# Patient Record
Sex: Female | Born: 1975
Health system: Southern US, Community
[De-identification: ages and names within clinical notes are randomized; demographics above are authoritative.]

## PROBLEM LIST (undated history)

## (undated) DIAGNOSIS — O24419 Gestational diabetes mellitus in pregnancy, unspecified control: Secondary | ICD-10-CM

## (undated) DIAGNOSIS — IMO0002 Reserved for concepts with insufficient information to code with codable children: Secondary | ICD-10-CM

## (undated) DIAGNOSIS — N390 Urinary tract infection, site not specified: Secondary | ICD-10-CM

## (undated) HISTORY — PX: HERNIA REPAIR: SHX51

## (undated) HISTORY — PX: CHOLECYSTECTOMY: SHX55

## (undated) HISTORY — PX: TUBAL LIGATION: SHX77

## (undated) HISTORY — PX: EXPLORATORY LAPAROTOMY: SUR591

---

## 2000-05-08 ENCOUNTER — Encounter: Payer: Self-pay | Admitting: Emergency Medicine

## 2000-05-08 ENCOUNTER — Emergency Department (HOSPITAL_COMMUNITY): Admission: EM | Admit: 2000-05-08 | Discharge: 2000-05-08 | Payer: Self-pay | Admitting: Emergency Medicine

## 2000-11-25 ENCOUNTER — Inpatient Hospital Stay (HOSPITAL_COMMUNITY): Admission: AD | Admit: 2000-11-25 | Discharge: 2000-11-25 | Payer: Self-pay | Admitting: *Deleted

## 2000-11-26 ENCOUNTER — Emergency Department (HOSPITAL_COMMUNITY): Admission: EM | Admit: 2000-11-26 | Discharge: 2000-11-27 | Payer: Self-pay | Admitting: Emergency Medicine

## 2001-03-13 ENCOUNTER — Encounter: Admission: RE | Admit: 2001-03-13 | Discharge: 2001-06-11 | Payer: Self-pay | Admitting: Obstetrics & Gynecology

## 2001-04-07 ENCOUNTER — Inpatient Hospital Stay (HOSPITAL_COMMUNITY): Admission: AD | Admit: 2001-04-07 | Discharge: 2001-04-07 | Payer: Self-pay | Admitting: Obstetrics and Gynecology

## 2001-04-09 ENCOUNTER — Inpatient Hospital Stay (HOSPITAL_COMMUNITY): Admission: AD | Admit: 2001-04-09 | Discharge: 2001-04-09 | Payer: Self-pay | Admitting: Obstetrics & Gynecology

## 2001-04-10 ENCOUNTER — Inpatient Hospital Stay (HOSPITAL_COMMUNITY): Admission: AD | Admit: 2001-04-10 | Discharge: 2001-04-10 | Payer: Self-pay | Admitting: Obstetrics and Gynecology

## 2001-04-16 ENCOUNTER — Inpatient Hospital Stay (HOSPITAL_COMMUNITY): Admission: AD | Admit: 2001-04-16 | Discharge: 2001-04-16 | Payer: Self-pay | Admitting: Obstetrics & Gynecology

## 2001-05-07 ENCOUNTER — Encounter: Payer: Self-pay | Admitting: Obstetrics & Gynecology

## 2001-05-07 ENCOUNTER — Ambulatory Visit (HOSPITAL_COMMUNITY): Admission: RE | Admit: 2001-05-07 | Discharge: 2001-05-07 | Payer: Self-pay | Admitting: Obstetrics & Gynecology

## 2001-05-19 ENCOUNTER — Inpatient Hospital Stay (HOSPITAL_COMMUNITY): Admission: AD | Admit: 2001-05-19 | Discharge: 2001-05-19 | Payer: Self-pay | Admitting: Obstetrics and Gynecology

## 2001-05-30 ENCOUNTER — Inpatient Hospital Stay (HOSPITAL_COMMUNITY): Admission: AD | Admit: 2001-05-30 | Discharge: 2001-06-02 | Payer: Self-pay | Admitting: Obstetrics & Gynecology

## 2001-05-30 ENCOUNTER — Encounter (INDEPENDENT_AMBULATORY_CARE_PROVIDER_SITE_OTHER): Payer: Self-pay

## 2001-07-03 ENCOUNTER — Other Ambulatory Visit: Admission: RE | Admit: 2001-07-03 | Discharge: 2001-07-03 | Payer: Self-pay | Admitting: Obstetrics & Gynecology

## 2002-03-19 ENCOUNTER — Encounter: Admission: RE | Admit: 2002-03-19 | Discharge: 2002-03-19 | Payer: Self-pay | Admitting: Obstetrics & Gynecology

## 2002-03-19 ENCOUNTER — Encounter: Payer: Self-pay | Admitting: Obstetrics & Gynecology

## 2003-01-27 ENCOUNTER — Emergency Department (HOSPITAL_COMMUNITY): Admission: EM | Admit: 2003-01-27 | Discharge: 2003-01-27 | Payer: Self-pay | Admitting: Emergency Medicine

## 2003-06-14 ENCOUNTER — Emergency Department (HOSPITAL_COMMUNITY): Admission: AD | Admit: 2003-06-14 | Discharge: 2003-06-15 | Payer: Self-pay | Admitting: Emergency Medicine

## 2003-06-15 ENCOUNTER — Encounter: Payer: Self-pay | Admitting: Emergency Medicine

## 2003-08-04 ENCOUNTER — Emergency Department (HOSPITAL_COMMUNITY): Admission: EM | Admit: 2003-08-04 | Discharge: 2003-08-05 | Payer: Self-pay | Admitting: Emergency Medicine

## 2005-01-09 ENCOUNTER — Emergency Department (HOSPITAL_COMMUNITY): Admission: EM | Admit: 2005-01-09 | Discharge: 2005-01-09 | Payer: Self-pay | Admitting: Emergency Medicine

## 2006-03-24 ENCOUNTER — Other Ambulatory Visit: Admission: RE | Admit: 2006-03-24 | Discharge: 2006-03-24 | Payer: Self-pay | Admitting: Internal Medicine

## 2006-04-06 ENCOUNTER — Encounter: Admission: RE | Admit: 2006-04-06 | Discharge: 2006-04-06 | Payer: Self-pay | Admitting: Internal Medicine

## 2007-04-05 ENCOUNTER — Emergency Department (HOSPITAL_COMMUNITY): Admission: EM | Admit: 2007-04-05 | Discharge: 2007-04-05 | Payer: Self-pay | Admitting: Emergency Medicine

## 2008-04-01 ENCOUNTER — Emergency Department (HOSPITAL_COMMUNITY): Admission: EM | Admit: 2008-04-01 | Discharge: 2008-04-01 | Payer: Self-pay | Admitting: Emergency Medicine

## 2008-05-04 ENCOUNTER — Emergency Department (HOSPITAL_COMMUNITY): Admission: EM | Admit: 2008-05-04 | Discharge: 2008-05-04 | Payer: Self-pay | Admitting: *Deleted

## 2008-05-12 ENCOUNTER — Emergency Department (HOSPITAL_COMMUNITY): Admission: EM | Admit: 2008-05-12 | Discharge: 2008-05-12 | Payer: Self-pay | Admitting: Emergency Medicine

## 2008-06-24 ENCOUNTER — Emergency Department (HOSPITAL_BASED_OUTPATIENT_CLINIC_OR_DEPARTMENT_OTHER): Admission: EM | Admit: 2008-06-24 | Discharge: 2008-06-24 | Payer: Self-pay | Admitting: Emergency Medicine

## 2008-07-18 ENCOUNTER — Emergency Department (HOSPITAL_BASED_OUTPATIENT_CLINIC_OR_DEPARTMENT_OTHER): Admission: EM | Admit: 2008-07-18 | Discharge: 2008-07-18 | Payer: Self-pay | Admitting: Emergency Medicine

## 2009-01-12 ENCOUNTER — Emergency Department (HOSPITAL_BASED_OUTPATIENT_CLINIC_OR_DEPARTMENT_OTHER): Admission: EM | Admit: 2009-01-12 | Discharge: 2009-01-12 | Payer: Self-pay | Admitting: Emergency Medicine

## 2009-02-22 ENCOUNTER — Emergency Department (HOSPITAL_BASED_OUTPATIENT_CLINIC_OR_DEPARTMENT_OTHER): Admission: EM | Admit: 2009-02-22 | Discharge: 2009-02-22 | Payer: Self-pay | Admitting: Emergency Medicine

## 2009-06-03 ENCOUNTER — Emergency Department (HOSPITAL_BASED_OUTPATIENT_CLINIC_OR_DEPARTMENT_OTHER): Admission: EM | Admit: 2009-06-03 | Discharge: 2009-06-03 | Payer: Self-pay | Admitting: Emergency Medicine

## 2010-06-09 ENCOUNTER — Emergency Department (HOSPITAL_BASED_OUTPATIENT_CLINIC_OR_DEPARTMENT_OTHER): Admission: EM | Admit: 2010-06-09 | Discharge: 2010-06-09 | Payer: Self-pay | Admitting: Emergency Medicine

## 2011-01-27 LAB — RAPID STREP SCREEN (MED CTR MEBANE ONLY): Streptococcus, Group A Screen (Direct): POSITIVE — AB

## 2011-03-04 NOTE — Discharge Summary (Signed)
[facility]  Patient:    Michelle Luna, Michelle Luna Visit Number: [id number] MRN: [id number]          Service Type: OBS Location: 910A [id number] Attending Physician:  Mickle Mallory Dictated by:   Miguel Aschoff, M.D. Admit Date:  05/30/2001 Discharge Date: 06/02/2001                             Discharge Summary  ADMISSION DIAGNOSES:          1. Intrauterine twin gestation at term.                               2. Desired sterilization.  OPERATIONS/PROCEDURES:        1. Primary low-flap transverse cesarean section.                               2. Bilateral tubal sterilization.  HISTORY OF PRESENT ILLNESS:   The patient is a 34 year old white female, gravida 2, para 1-0-0-1, with an estimated date of confinement of June 08, 2001.  The patient was noted to have an intrauterine twin gestation presenting as vertex breech.  In addition, the patient has requested that a sterilization procedure be performed.  Because of the presentation of the second twin, the options of delivery were discussed, and the patient opted to undergo primary cesarean section.  In addition, the patient requested a sterilization procedure.  HOSPITAL COURSE:              On May 30, 2001, under spinal anesthesia a primary low-flap transverse cesarean section was carried out with delivery of a viable female infant weighing 7 pounds with an Apgar score of 8 at one minute and 9 at five minutes and delivery of a viable female infant weighing 5 pounds 4 ounces with an Apgar score of 8 at one minute and 9 at five minutes.  In addition, tubal sterilization was carried out.  The patients postpartum course was essentially uncomplicated.  She tolerated increasing ambulation and diet well.  Her hemoglobin remained stable with a nadir of 11.1.  She was in satisfactory condition and able to be discharged home on June 02, 2001.  MEDICATIONS:                  Tylox 1-2 every three hours as  needed for pain.  DISCHARGE INSTRUCTIONS:       She was instructed to do no heavy lifting. Place nothing in the vagina.  Call if there are any problems such as fever, pain, or heavy bleeding.  A routine postpartum booklet of instructions was sent home with the patient.  FOLLOW-UP:                    The patient will be seen back in the office in five to six weeks for a follow-up evaluation. Dictated by:   Miguel Aschoff, M.D. Attending Physician:  Mickle Mallory DD:  07/08/01 TD:  07/08/01 Job: [id number]

## 2011-03-04 NOTE — Op Note (Signed)
[facility]  Patient:    Michelle Luna, Michelle Luna                   MRN: [id number] Proc. Date: 05/30/01 Adm. Date:  [id number] Attending:  Mickle Mallory                           Operative Report  PATIENTS AGE IS 35.  PREOPERATIVE DIAGNOSES:       1. Term pregnancy, twin pregnancy.                               2. Desire for permanent elective sterilization.  POSTOPERATIVE DIAGNOSES:      1. Term pregnancy, twin pregnancy.                               2. Desire for permanent elective sterilization.                               3. Pathology pending on sections of fallopian                                  tube.                               4. Twin A--female--7 pounds--Apgars 8 and 9 and                                  Twin B--female--5 pounds 4 ounces--Apgars 8                                  and 9.  PROCEDURE:                    1. Primary low transverse cesarean section.                               2. Tubal sterilization for permanent elective                                  sterilization.  SURGEON:                      Gerrit Friends. Aldona Bar, M.D.  ANESTHESIA:                   Subarachnoid block, Dr. Tacy Dura.  HISTORY:                      This 35 year old, gravida 2, para 1, with known twins has been followed carefully in the office and most recently was vertex/breech with fairly good growth of both twins. She is desirous of a permanent elective sterilization procedure understanding that such procedures are meant to be 100% permanent, but unfortunately is not 100% perfect. She is also desirous of a cesarean section delivery, especially with the second baby being breech.  She is taken to the operating room now for cesarean section and tubal sterilization procedure.  ESTIMATED BLOOD LOSS:         750 cc.  DESCRIPTION OF PROCEDURE:     The patient was taken to the operating room where after satisfactory induction of subarachnoid block by  Dr. Tacy Dura, the patient was prepped and draped in the usual fashion having been placed in supine position with slight tilt to the left. A Foley catheter was inserted as part of the prep.  After the patient was adequately draped, anesthetic levels were checked and noted to be adequate. Thereafter, a Pfannenstiel incision was made with minimal difficulty, dissected down sharply to and through the fascia with hemostasis created at each layer. The fascia was incised in low transverse fashion, subfascial space was created inferiorly and superiorly, muscles separated in the midline, the peritoneum identified and entered appropriately with care taken to avoid the bowel superiorly and the bladder inferiorly. At this time, the vesicouterine peritoneum was incised in low transverse fashion, and pushed off the lower uterine segment with ease. Sharp incision of the uterus in the low transverse fashion was then made with the Metzenbaum scissors. This incision was extended with the fingers. Twin A was vertex--fluid was clear--and with minimal difficulty, (vacuum extractor was employed for an easy delivery) a viable female infant with Apgars of 8 and 9, subsequently found to be weighing 7 pounds, was delivered from a vertex position. The cord was clamped and cut and the infant was passed off to the awaiting team. Thereafter, Twin B was found to be in a breech presentation, actually a back down transverse presentation. Internal version was done to a vertex and amniotomy then carried out with production of clear fluid and thereafter, Twin B was delivered from a vertex position. Twin B was female, weighed 5 pounds 4 ounces, had Apgars of 8 and 9.  After that babys cord was clamped and cut that infant was passed off to the awaiting team as well. Both infants were subsequently taken to the nursery in good condition.  After the cord bloods were collected from Twin A and Twin B, respectively, the placenta was  delivered intact. The uterus at this time was exteriorized and manually inspected and found free of any remaining products of conception. Closure of the uterus at this time was carried out using single layer of #1 Vicryl in a running locking fashion. Hemostasis was adequate at the site of the uterine incision. Tubes and ovaries were normal, and with both babies being normal, tubal sterilization procedure was carried out.  A segment of the left fallopian tube was elevated, and using a free tie of #1 plain catgut suture, a knuckle of the tube was isolated, cut, and removed. A similar procedure was carried out on the right fallopian tube. Both segments of tube were sent appropriately labeled.  At this time with no pathology appreciated, good hemostasis at the site of each tubal sterilization site, and the uterine incision likewise being hemostatic, the uterus was then replaced into the abdomen and abdomen lavaged of all free blood and clot. Good uterine contractility was afforded with slowly given intravenous Pitocin and manual stimulation. At this time, closure of the abdomen was begun in layers. The abdominoperitoneum was closed with 0 Vicryl in a running fashion, the muscles secured with same. Assured of good subfascial hemostasis, the fascia was then reapproximated with 0 Vicryl from angle to midline bilaterally. Subcutaneous tissue was then rendered hemostatic and staples  were then used to close the skin. A sterile pressure dressing was applied, and the patient at this time was transported to the recovery room in satisfactory condition having tolerated the procedure well. Estimated blood loss was 750 cc. All counts were correct x 2. At the conclusion of the procedure, both mother and the two babies were doing well in their respective recovery areas.  SPECIMEN:                     Pathologic specimen consisted of a segment of each fallopian tube.  CONDITION ON ARRIVAL TO RECOVERY  ROOM:             Satisfactory. DD:  05/30/01 TD:  05/30/01 Job: [id number]

## 2011-05-11 ENCOUNTER — Emergency Department (HOSPITAL_BASED_OUTPATIENT_CLINIC_OR_DEPARTMENT_OTHER)
Admission: EM | Admit: 2011-05-11 | Discharge: 2011-05-11 | Disposition: A | Discharge status: Home / Self Care | Payer: Self-pay | Attending: Emergency Medicine | Admitting: Emergency Medicine

## 2011-05-11 ENCOUNTER — Encounter: Payer: Self-pay | Admitting: Emergency Medicine

## 2011-05-11 DIAGNOSIS — Z79899 Other long term (current) drug therapy: Secondary | ICD-10-CM | POA: Insufficient documentation

## 2011-05-11 DIAGNOSIS — T63461A Toxic effect of venom of wasps, accidental (unintentional), initial encounter: Secondary | ICD-10-CM | POA: Insufficient documentation

## 2011-05-11 DIAGNOSIS — IMO0002 Reserved for concepts with insufficient information to code with codable children: Secondary | ICD-10-CM | POA: Insufficient documentation

## 2011-05-11 DIAGNOSIS — T6391XA Toxic effect of contact with unspecified venomous animal, accidental (unintentional), initial encounter: Secondary | ICD-10-CM | POA: Insufficient documentation

## 2011-05-11 DIAGNOSIS — T782XXA Anaphylactic shock, unspecified, initial encounter: Secondary | ICD-10-CM

## 2011-05-11 DIAGNOSIS — F17200 Nicotine dependence, unspecified, uncomplicated: Secondary | ICD-10-CM | POA: Insufficient documentation

## 2011-05-11 HISTORY — DX: Reserved for concepts with insufficient information to code with codable children: IMO0002

## 2011-05-11 MED ORDER — METHYLPREDNISOLONE SODIUM SUCC 125 MG IJ SOLR
80.0000 mg | Freq: Once | INTRAMUSCULAR | Status: AC
  Administered 2011-05-11: 81.25 mg via INTRAVENOUS
  Filled 2011-05-11: qty 2

## 2011-05-11 MED ORDER — PREDNISONE 10 MG PO TABS: 20.0000 mg | ORAL_TABLET | Freq: Two times a day (BID) | ORAL | Status: AC

## 2011-05-11 MED ORDER — EPINEPHRINE 0.3 MG/0.3ML IJ DEVI: 0.3000 mg | INTRAMUSCULAR | Status: AC | PRN

## 2011-05-11 MED ORDER — EPINEPHRINE 0.3 MG/0.3ML IJ DEVI
0.3000 mg | Freq: Once | INTRAMUSCULAR | Status: AC
  Administered 2011-05-11: 0.3 mg via SUBCUTANEOUS
  Filled 2011-05-11: qty 0.3

## 2011-05-11 NOTE — ED Notes (Signed)
Pt stung by a bee on chin. Pt had allergic reaction including wheezing, tachycardia and diaphoresis per EMT. Pt received benadryl, zantac, and albuterol. Pt reports feeling better on arrival.

## 2011-05-11 NOTE — ED Notes (Signed)
Pt given blanket and ice water. Family at bedside

## 2011-05-11 NOTE — ED Provider Notes (Signed)
History     Chief Complaint  Patient presents with  . Insect Bite   HPI Comments: Was stung on chin by some sort of bee/wasp.  Shortly after, began to have swelling in the chin, dizziness, shortness of breath.  Daughter called 911, was given meds by ambulance.  Feeling better now.  Has had minor reactions to bee stings in the past (rash) but nothing like this.  The history is provided by the patient.    Past Medical History  Diagnosis Date  . Degenerative disk disease     Past Surgical History  Procedure Date  . Cesarean section   . Cholecystectomy   . Exploratory laparotomy     No family history on file.  History  Substance Use Topics  . Smoking status: Current Everyday Smoker  . Smokeless tobacco: Not on file  . Alcohol Use: Yes    OB History    Grav Para Term Preterm Abortions TAB SAB Ect Mult Living                  Review of Systems  Respiratory: Positive for shortness of breath and wheezing.   Cardiovascular: Negative for chest pain.  Skin:       Swelling as above.  All other systems reviewed and are negative.    Physical Exam  BP 113/53  Pulse 103  Temp(Src) 98.2 F (36.8 C) (Oral)  Resp 20  SpO2 97%  Physical Exam  Constitutional: She appears well-developed and well-nourished. No distress.  HENT:  Head: Normocephalic and atraumatic.  Mouth/Throat: Oropharynx is clear and moist.  Neck: Normal range of motion. Neck supple.  Cardiovascular: Normal rate and regular rhythm.  Exam reveals no gallop and no friction rub.   No murmur heard. Pulmonary/Chest: Effort normal and breath sounds normal. No respiratory distress. She has no wheezes.  Skin: She is not diaphoretic.       There is an area of swelling, erythema to the bottom of the chin.      ED Course  Procedures  MDM Was given meds en route and improved.  While here, given steroids, then broke out into urticarial rash.  Was given sq epi and improved.  Will dc with prednisone and benadryl,  give epipen too.      [facility] 05/11/11 2311

## 2011-06-21 ENCOUNTER — Inpatient Hospital Stay (INDEPENDENT_AMBULATORY_CARE_PROVIDER_SITE_OTHER)
Admission: RE | Admit: 2011-06-21 | Discharge: 2011-06-21 | Disposition: A | Discharge status: Home / Self Care | Payer: Self-pay | Source: Ambulatory Visit | Attending: Family Medicine | Admitting: Family Medicine

## 2011-06-21 DIAGNOSIS — M25519 Pain in unspecified shoulder: Secondary | ICD-10-CM

## 2011-06-21 DIAGNOSIS — M799 Soft tissue disorder, unspecified: Secondary | ICD-10-CM

## 2011-07-15 ENCOUNTER — Encounter (HOSPITAL_BASED_OUTPATIENT_CLINIC_OR_DEPARTMENT_OTHER): Payer: Self-pay

## 2011-07-15 ENCOUNTER — Emergency Department (INDEPENDENT_AMBULATORY_CARE_PROVIDER_SITE_OTHER): Payer: Self-pay

## 2011-07-15 ENCOUNTER — Emergency Department (HOSPITAL_BASED_OUTPATIENT_CLINIC_OR_DEPARTMENT_OTHER)
Admission: EM | Admit: 2011-07-15 | Discharge: 2011-07-16 | Disposition: A | Discharge status: Home / Self Care | Payer: Self-pay | Attending: Emergency Medicine | Admitting: Emergency Medicine

## 2011-07-15 DIAGNOSIS — R109 Unspecified abdominal pain: Secondary | ICD-10-CM | POA: Insufficient documentation

## 2011-07-15 DIAGNOSIS — R609 Edema, unspecified: Secondary | ICD-10-CM

## 2011-07-15 DIAGNOSIS — I880 Nonspecific mesenteric lymphadenitis: Secondary | ICD-10-CM | POA: Insufficient documentation

## 2011-07-15 DIAGNOSIS — K2970 Gastritis, unspecified, without bleeding: Secondary | ICD-10-CM | POA: Insufficient documentation

## 2011-07-15 LAB — URINALYSIS, ROUTINE W REFLEX MICROSCOPIC
Glucose, UA: NEGATIVE mg/dL
Leukocytes, UA: NEGATIVE
Nitrite: NEGATIVE
Specific Gravity, Urine: 1.022 (ref 1.005–1.030)
Urobilinogen, UA: 0.2 mg/dL (ref 0.0–1.0)

## 2011-07-15 LAB — URINE MICROSCOPIC-ADD ON

## 2011-07-15 LAB — BASIC METABOLIC PANEL
Chloride: 104 mEq/L (ref 96–112)
GFR calc Af Amer: >60 mL/min (ref >60)
Glucose, Bld: 111 mg/dL — ABNORMAL HIGH (ref 70–99)
Potassium: 3.8 mEq/L (ref 3.5–5.1)
Sodium: 137 mEq/L (ref 135–145)

## 2011-07-15 LAB — POCT I-STAT, CHEM 8
Calcium, Ion: 1.17
Chloride: 107
Creatinine, Ser: 0.8
Potassium: 3.6
Sodium: 140

## 2011-07-15 LAB — CBC
HCT: 39.4 % (ref 36.0–46.0)
Hemoglobin: 13.7 g/dL (ref 12.0–15.0)
MCV: 94 fL (ref 78.0–100.0)
Platelets: 156 10*3/uL (ref 150–400)
RBC: 4.19 MIL/uL (ref 3.87–5.11)

## 2011-07-15 LAB — LIPASE, BLOOD: Lipase: 21 U/L (ref 11–59)

## 2011-07-15 MED ORDER — ONDANSETRON HCL 4 MG/2ML IJ SOLN
4.0000 mg | Freq: Once | INTRAMUSCULAR | Status: AC
  Administered 2011-07-15: 4 mg via INTRAVENOUS
  Filled 2011-07-15: qty 2

## 2011-07-15 MED ORDER — PANTOPRAZOLE SODIUM 40 MG IV SOLR
40.0000 mg | Freq: Once | INTRAVENOUS | Status: AC
Start: 2011-07-15 — End: 2011-07-15
  Administered 2011-07-15: 40 mg via INTRAVENOUS
  Filled 2011-07-15: qty 40

## 2011-07-15 MED ORDER — GI COCKTAIL ~~LOC~~
30.0000 mL | Freq: Once | ORAL | Status: AC
  Administered 2011-07-15: 30 mL via ORAL
  Filled 2011-07-15: qty 30

## 2011-07-15 MED ORDER — MORPHINE SULFATE 4 MG/ML IJ SOLN
4.0000 mg | Freq: Once | INTRAMUSCULAR | Status: AC
  Administered 2011-07-15: 4 mg via INTRAVENOUS
  Filled 2011-07-15: qty 1

## 2011-07-15 NOTE — ED Provider Notes (Signed)
History     CSN: [id number] Arrival date & time: 07/15/2011  8:42 PM  Chief Complaint  Patient presents with  . Abdominal Cramping    (Consider location/radiation/quality/duration/timing/severity/associated sxs/prior treatment) HPI Comments: Pt with 2 day hx of pain to upper abdomen.  Constant dull pain with intermittent bad cramps to upper abd.  S/p cholecystectomy 13 years ago  Patient is a 35 y.o. female presenting with cramps. The history is provided by the patient.  Abdominal Cramping The primary symptoms of the illness include abdominal pain, fatigue and nausea. The primary symptoms of the illness do not include fever, shortness of breath, vomiting or diarrhea. The current episode started 2 days ago. The onset of the illness was gradual. The problem has been gradually worsening.  The patient has not had a change in bowel habit. Additional symptoms associated with the illness include heartburn. Symptoms associated with the illness do not include chills, diaphoresis, urgency, hematuria, frequency or back pain. Significant associated medical issues include GERD.    Past Medical History  Diagnosis Date  . Degenerative disk disease     Past Surgical History  Procedure Date  . Cesarean section   . Cholecystectomy   . Exploratory laparotomy   . Hernia repair   . Tubal ligation     History reviewed. No pertinent family history.  History  Substance Use Topics  . Smoking status: Current Everyday Smoker -- 0.7 packs/day for 20 years    Types: Cigarettes  . Smokeless tobacco: Not on file  . Alcohol Use: Yes     occasional use    OB History    Grav Para Term Preterm Abortions TAB SAB Ect Mult Living                  Review of Systems  Constitutional: Positive for fatigue. Negative for fever, chills and diaphoresis.  HENT: Negative for congestion, rhinorrhea and sneezing.   Eyes: Negative.   Respiratory: Negative for cough, chest tightness and shortness of breath.     Cardiovascular: Negative for chest pain and leg swelling.  Gastrointestinal: Positive for heartburn, nausea and abdominal pain. Negative for vomiting, diarrhea and blood in stool.  Genitourinary: Negative for urgency, frequency, hematuria, flank pain and difficulty urinating.  Musculoskeletal: Negative for back pain and arthralgias.  Skin: Negative for rash.  Neurological: Negative for dizziness, speech difficulty, weakness, numbness and headaches.    Allergies  Erythromycin and Sulfa antibiotics  Home Medications   Current Outpatient Rx  Name Route Sig Dispense Refill  . CALCIUM CARBONATE ANTACID 750 MG PO CHEW Oral Chew 3 tablets by mouth every 3 (three) hours as needed. For indigestion     . DIPHENHYDRAMINE HCL 50 MG PO CAPS Oral Take 50 mg by mouth once.      . ALBUTEROL SULFATE (5 MG/ML) 0.5% IN NEBU Nebulization Take 2.5 mg by nebulization once.      . ALBUTEROL 90 MCG/ACT IN AERS Inhalation Inhale 2 puffs into the lungs as needed. Shortness of breath      . BUPRENORPHINE HCL-NALOXONE HCL 8-2 MG SL FILM Sublingual Place 2 mg under the tongue daily.      Marland Kitchen BUPRENORPHINE HCL-NALOXONE HCL 2-0.5 MG SL SUBL Sublingual Place 1 tablet under the tongue daily.      Marland Kitchen EPINEPHRINE 0.3 MG/0.3ML IJ DEVI Intramuscular Inject 0.3 mLs (0.3 mg total) into the muscle as needed. 1 mL 1  . ESOMEPRAZOLE MAGNESIUM 40 MG PO CPDR Oral Take 1 capsule (40 mg total) by mouth  daily. 30 capsule 0  . HYDROCODONE-ACETAMINOPHEN 5-500 MG PO TABS Oral Take 1-2 tablets by mouth every 6 (six) hours as needed for pain. 15 tablet 0  . IBUPROFEN 200 MG PO TABS Oral Take 800 mg by mouth once.      Marland Kitchen VICKS SINEX 12 HOUR NA Nasal Place 1-2 sprays into the nose as needed. Sinus      . RANITIDINE HCL 50 MG/2ML IJ SOLN Intravenous Inject 50 mg into the vein once.      . SUCRALFATE 1 G PO TABS Oral Take 1 tablet (1 g total) by mouth 4 (four) times daily. 30 tablet 0    BP 131/84  Pulse 92  Temp(Src) 98.3 F (36.8 C)  (Oral)  Resp 18  Ht 5\' 5"  (1.651 m)  Wt 145 lb (65.772 kg)  BMI 24.13 kg/m2  SpO2 99%  LMP 07/03/2011  Physical Exam  Constitutional: She is oriented to person, place, and time. She appears well-developed and well-nourished.  HENT:  Head: Normocephalic and atraumatic.  Eyes: Pupils are equal, round, and reactive to light.  Neck: Normal range of motion. Neck supple.  Cardiovascular: Normal rate, regular rhythm and normal heart sounds.   Pulmonary/Chest: Effort normal and breath sounds normal. No respiratory distress. She has no wheezes. She has no rales. She exhibits no tenderness.  Abdominal: Soft. Bowel sounds are normal. There is tenderness. There is no rebound and no guarding.       Moderate tenderness to upper abd,   Musculoskeletal: Normal range of motion. She exhibits no edema.  Lymphadenopathy:    She has no cervical adenopathy.  Neurological: She is alert and oriented to person, place, and time.  Skin: Skin is warm and dry. No rash noted.  Psychiatric: She has a normal mood and affect.    ED Course  Procedures (including critical care time)  Labs Reviewed  URINALYSIS, ROUTINE W REFLEX MICROSCOPIC - Abnormal; Notable for the following:    Appearance CLOUDY (*)    Hgb urine dipstick SMALL (*)    All other components within normal limits  URINE MICROSCOPIC-ADD ON - Abnormal; Notable for the following:    Squamous Epithelial / LPF MANY (*)    Bacteria, UA FEW (*)    All other components within normal limits  CBC - Abnormal; Notable for the following:    WBC 12.1 (*)    All other components within normal limits  BASIC METABOLIC PANEL - Abnormal; Notable for the following:    Glucose, Bld 111 (*)    All other components within normal limits  PREGNANCY, URINE  LIPASE, BLOOD   Dg Abd Acute W/chest  07/15/2011  *RADIOLOGY REPORT*  Clinical Data: Abdominal pain  ACUTE ABDOMEN SERIES (ABDOMEN 2 VIEW & CHEST 1 VIEW)  Comparison: Chest radiograph dated 05/12/2008.  CT  abdomen/pelvis dated 01/09/2005.  Findings: Lungs are clear. No pleural effusion or pneumothorax.  Cardiomediastinal silhouette is within normal limits.  Nonspecific bowel gas pattern without findings suspicious for obstruction.  Suspected mucosal edema involving a loop of small bowel.  No evidence of free air under the diaphragm on the upright view.  Cholecystectomy clips.  Visualized osseous structures are within normal limits.  IMPRESSION: No evidence of acute cardiopulmonary disease.  No evidence of small bowel obstruction or free air.  Suspected mucosal edema involving a loop of small bowel. Consider CT abdomen pelvis for further characterization.  Original Report Authenticated By: Charline Bills, M.D.     1. Gastritis  MDM  Pancreatitis vs PUD vs perforation      Results for orders placed during the hospital encounter of 07/15/11  URINALYSIS, ROUTINE W REFLEX MICROSCOPIC      Component Value Range   Color, Urine YELLOW  YELLOW    Appearance CLOUDY (*) CLEAR    Specific Gravity, Urine 1.022  1.005 - 1.030    pH 5.5  5.0 - 8.0    Glucose, UA NEGATIVE  NEGATIVE (mg/dL)   Hgb urine dipstick SMALL (*) NEGATIVE    Bilirubin Urine NEGATIVE  NEGATIVE    Ketones, ur NEGATIVE  NEGATIVE (mg/dL)   Protein, ur NEGATIVE  NEGATIVE (mg/dL)   Urobilinogen, UA 0.2  0.0 - 1.0 (mg/dL)   Nitrite NEGATIVE  NEGATIVE    Leukocytes, UA NEGATIVE  NEGATIVE   PREGNANCY, URINE      Component Value Range   Preg Test, Ur NEGATIVE    URINE MICROSCOPIC-ADD ON      Component Value Range   Squamous Epithelial / LPF MANY (*) RARE    WBC, UA 0-2  <3 (WBC/hpf)   RBC / HPF 3-6  <3 (RBC/hpf)   Bacteria, UA FEW (*) RARE    Urine-Other MUCOUS PRESENT    CBC      Component Value Range   WBC 12.1 (*) 4.0 - 10.5 (K/uL)   RBC 4.19  3.87 - 5.11 (MIL/uL)   Hemoglobin 13.7  12.0 - 15.0 (g/dL)   HCT 45.4  09.8 - 11.9 (%)   MCV 94.0  78.0 - 100.0 (fL)   MCH 32.7  26.0 - 34.0 (pg)   MCHC 34.8  30.0 - 36.0  (g/dL)   RDW 14.7  82.9 - 56.2 (%)   Platelets 156  150 - 400 (K/uL)  BASIC METABOLIC PANEL      Component Value Range   Sodium 137  135 - 145 (mEq/L)   Potassium 3.8  3.5 - 5.1 (mEq/L)   Chloride 104  96 - 112 (mEq/L)   CO2 23  19 - 32 (mEq/L)   Glucose, Bld 111 (*) 70 - 99 (mg/dL)   BUN 9  6 - 23 (mg/dL)   Creatinine, Ser 1.30  0.50 - 1.10 (mg/dL)   Calcium 9.5  8.4 - 86.5 (mg/dL)   GFR calc non Af Amer >60  >60 (mL/min)   GFR calc Af Amer >60  >60 (mL/min)  LIPASE, BLOOD      Component Value Range   Lipase 21  11 - 59 (U/L)   No results found. Pt will get CT abd/pelvis.  Turn pt over to Dr. Judd Lien pending these results.  If negative, can go home   Rolan Bucco, MD 07/16/11 [phone]

## 2011-07-15 NOTE — ED Notes (Signed)
Pt states that she has had severe abdominal cramping from her naval to just below her sternum for the past two days.  Pt states that she has been taking tums with no relief.  Pt states that she also has pain on the Right shoulder blade radiating up into her neck.

## 2011-07-16 ENCOUNTER — Emergency Department (INDEPENDENT_AMBULATORY_CARE_PROVIDER_SITE_OTHER): Payer: Self-pay

## 2011-07-16 DIAGNOSIS — R599 Enlarged lymph nodes, unspecified: Secondary | ICD-10-CM

## 2011-07-16 DIAGNOSIS — R109 Unspecified abdominal pain: Secondary | ICD-10-CM

## 2011-07-16 MED ORDER — MORPHINE SULFATE 4 MG/ML IJ SOLN
4.0000 mg | Freq: Once | INTRAMUSCULAR | Status: AC
  Administered 2011-07-16: 4 mg via INTRAVENOUS
  Filled 2011-07-16: qty 1

## 2011-07-16 MED ORDER — IOHEXOL 300 MG/ML  SOLN
100.0000 mL | Freq: Once | INTRAMUSCULAR | Status: AC | PRN
  Administered 2011-07-16: 100 mL via INTRAVENOUS

## 2011-07-16 MED ORDER — HYDROCODONE-ACETAMINOPHEN 5-500 MG PO TABS
1.0000 | ORAL_TABLET | Freq: Four times a day (QID) | ORAL | Status: AC | PRN
Start: 2011-07-16 — End: 2011-07-26

## 2011-07-16 MED ORDER — ESOMEPRAZOLE MAGNESIUM 40 MG PO CPDR: 40.0000 mg | DELAYED_RELEASE_CAPSULE | Freq: Every day | ORAL | Status: DC

## 2011-07-16 MED ORDER — SUCRALFATE 1 G PO TABS: 1.0000 g | ORAL_TABLET | Freq: Four times a day (QID) | ORAL | Status: DC

## 2011-07-16 MED ORDER — HYDROCODONE-ACETAMINOPHEN 5-500 MG PO TABS: 1.0000 | ORAL_TABLET | Freq: Four times a day (QID) | ORAL | Status: DC | PRN

## 2011-07-16 NOTE — ED Provider Notes (Signed)
History     CSN: [id number] Arrival date & time: 07/15/2011  8:42 PM  Chief Complaint  Patient presents with  . Abdominal Cramping    (Consider location/radiation/quality/duration/timing/severity/associated sxs/prior treatment) HPI  Past Medical History  Diagnosis Date  . Degenerative disk disease     Past Surgical History  Procedure Date  . Cesarean section   . Cholecystectomy   . Exploratory laparotomy   . Hernia repair   . Tubal ligation     History reviewed. No pertinent family history.  History  Substance Use Topics  . Smoking status: Current Everyday Smoker -- 0.7 packs/day for 20 years    Types: Cigarettes  . Smokeless tobacco: Not on file  . Alcohol Use: Yes     occasional use    OB History    Grav Para Term Preterm Abortions TAB SAB Ect Mult Living                  Review of Systems  Allergies  Erythromycin and Sulfa antibiotics  Home Medications   Current Outpatient Rx  Name Route Sig Dispense Refill  . CALCIUM CARBONATE ANTACID 750 MG PO CHEW Oral Chew 3 tablets by mouth every 3 (three) hours as needed. For indigestion     . DIPHENHYDRAMINE HCL 50 MG PO CAPS Oral Take 50 mg by mouth once.      . ALBUTEROL SULFATE (5 MG/ML) 0.5% IN NEBU Nebulization Take 2.5 mg by nebulization once.      . ALBUTEROL 90 MCG/ACT IN AERS Inhalation Inhale 2 puffs into the lungs as needed. Shortness of breath      . BUPRENORPHINE HCL-NALOXONE HCL 8-2 MG SL FILM Sublingual Place 2 mg under the tongue daily.      Marland Kitchen BUPRENORPHINE HCL-NALOXONE HCL 2-0.5 MG SL SUBL Sublingual Place 1 tablet under the tongue daily.      Marland Kitchen EPINEPHRINE 0.3 MG/0.3ML IJ DEVI Intramuscular Inject 0.3 mLs (0.3 mg total) into the muscle as needed. 1 mL 1  . ESOMEPRAZOLE MAGNESIUM 40 MG PO CPDR Oral Take 1 capsule (40 mg total) by mouth daily. 30 capsule 0  . IBUPROFEN 200 MG PO TABS Oral Take 800 mg by mouth once.      Marland Kitchen VICKS SINEX 12 HOUR NA Nasal Place 1-2 sprays into the nose as needed.  Sinus      . RANITIDINE HCL 50 MG/2ML IJ SOLN Intravenous Inject 50 mg into the vein once.      . SUCRALFATE 1 G PO TABS Oral Take 1 tablet (1 g total) by mouth 4 (four) times daily. 30 tablet 0    BP 129/76  Pulse 90  Temp(Src) 98.3 F (36.8 C) (Oral)  Resp 18  Ht 5\' 5"  (1.651 m)  Wt 145 lb (65.772 kg)  BMI 24.13 kg/m2  SpO2 100%  LMP 07/03/2011  Physical Exam  ED Course  Procedures (including critical care time)  Labs Reviewed  URINALYSIS, ROUTINE W REFLEX MICROSCOPIC - Abnormal; Notable for the following:    Appearance CLOUDY (*)    Hgb urine dipstick SMALL (*)    All other components within normal limits  URINE MICROSCOPIC-ADD ON - Abnormal; Notable for the following:    Squamous Epithelial / LPF MANY (*)    Bacteria, UA FEW (*)    All other components within normal limits  CBC - Abnormal; Notable for the following:    WBC 12.1 (*)    All other components within normal limits  BASIC METABOLIC PANEL - Abnormal;  Notable for the following:    Glucose, Bld 111 (*)    All other components within normal limits  PREGNANCY, URINE  LIPASE, BLOOD   Ct Abdomen Pelvis W Contrast  07/16/2011  *RADIOLOGY REPORT*  Clinical Data: Abdominal pain and cramping for 2 days.  CT ABDOMEN AND PELVIS WITH CONTRAST  Technique:  Multidetector CT imaging of the abdomen and pelvis was performed following the standard protocol during bolus administration of intravenous contrast.  Contrast: OMNIPAQUE IOHEXOL 300 MG/ML IV SOLN  Comparison: 01/09/2005  Findings: Mild dependent changes in the lung bases.  Surgical absence of the gallbladder.  Mild prominence of the bile ducts, likely postoperative.  No focal liver lesions.  Normal spleen size. The pancreas is unremarkable.  No adrenal gland nodules.  Kidneys are symmetrical without solid mass or hydronephrosis.  The stomach and small bowel are unremarkable.  No bowel dilatation.  Normal caliber abdominal aorta.  No retroperitoneal lymphadenopathy.   No free fluid or free air in the abdomen.  There is prominence of mesenteric lymph nodes vertically in the right lower quadrant, with individual lymph nodes measuring up to 13 mm in diameter.  These suggest reactive or inflammatory nodes and could represent changes of mesenteric adenitis.  Pelvis:   The bladder wall is not thickened.  The uterus is anteverted and is not enlarged.  No abnormal adnexal masses.  Small amount of free fluid in the pelvis which might be physiologic.  No loculated fluid collections.  Normal alignment of the lumbar spine. The appendix is not specifically identified but no inflammatory changes are suggested in the right lower quadrant.  IMPRESSION: Mildly prominent mesenteric and right lower quadrant lymph nodes are probably reactive and may suggest mesenteric adenitis.  No other acute abnormalities demonstrated.  Original Report Authenticated By: Marlon Pel, M.D.   Dg Abd Acute W/chest  07/15/2011  *RADIOLOGY REPORT*  Clinical Data: Abdominal pain  ACUTE ABDOMEN SERIES (ABDOMEN 2 VIEW & CHEST 1 VIEW)  Comparison: Chest radiograph dated 05/12/2008.  CT abdomen/pelvis dated 01/09/2005.  Findings: Lungs are clear. No pleural effusion or pneumothorax.  Cardiomediastinal silhouette is within normal limits.  Nonspecific bowel gas pattern without findings suspicious for obstruction.  Suspected mucosal edema involving a loop of small bowel.  No evidence of free air under the diaphragm on the upright view.  Cholecystectomy clips.  Visualized osseous structures are within normal limits.  IMPRESSION: No evidence of acute cardiopulmonary disease.  No evidence of small bowel obstruction or free air.  Suspected mucosal edema involving a loop of small bowel. Consider CT abdomen pelvis for further characterization.  Original Report Authenticated By: Charline Bills, M.D.     1. Gastritis       MDM  Care assumed from Dr. Fredderick Phenix.  I agree with her assessment and plan as above.  The CT  scan shows mesenteric adenitis, but no acute process otherwise.  Will discharge to home with pain medications and give time.          Geoffery Lyons, MD 07/16/11 [phone]

## 2011-07-16 NOTE — ED Notes (Signed)
Pending results of Cat Scan. Pt aware. Call bell within reach and pt informed to notify staff of worsening pain.

## 2011-07-16 NOTE — ED Notes (Signed)
Pt reports worsening pain. Rates at 6/10. Described as cramping.

## 2011-07-19 LAB — WET PREP, GENITAL: WBC, Wet Prep HPF POC: NONE SEEN

## 2011-07-19 LAB — COMPREHENSIVE METABOLIC PANEL
AST: 28
Albumin: 4.6
Alkaline Phosphatase: 86
BUN: 9
Chloride: 108
Potassium: 4.6
Total Bilirubin: 0.3

## 2011-07-19 LAB — URINALYSIS, ROUTINE W REFLEX MICROSCOPIC
Glucose, UA: NEGATIVE
Ketones, ur: NEGATIVE
Nitrite: NEGATIVE
Protein, ur: NEGATIVE
Urobilinogen, UA: 0.2

## 2011-07-19 LAB — CBC
HCT: 42.1
Platelets: 179
RBC: 4.4
WBC: 12.6 — ABNORMAL HIGH

## 2011-07-19 LAB — PREGNANCY, URINE: Preg Test, Ur: NEGATIVE

## 2011-07-19 LAB — DIFFERENTIAL
Basophils Absolute: 0.1
Basophils Relative: 0
Eosinophils Relative: 1
Monocytes Absolute: 0.8
Neutro Abs: 8.7 — ABNORMAL HIGH

## 2011-07-19 LAB — RPR: RPR Ser Ql: NONREACTIVE

## 2011-07-20 ENCOUNTER — Encounter (HOSPITAL_BASED_OUTPATIENT_CLINIC_OR_DEPARTMENT_OTHER): Payer: Self-pay | Admitting: *Deleted

## 2011-07-20 ENCOUNTER — Emergency Department (HOSPITAL_BASED_OUTPATIENT_CLINIC_OR_DEPARTMENT_OTHER)
Admission: EM | Admit: 2011-07-20 | Discharge: 2011-07-20 | Disposition: A | Discharge status: Home / Self Care | Payer: Self-pay | Attending: Emergency Medicine | Admitting: Emergency Medicine

## 2011-07-20 DIAGNOSIS — IMO0002 Reserved for concepts with insufficient information to code with codable children: Secondary | ICD-10-CM | POA: Insufficient documentation

## 2011-07-20 DIAGNOSIS — F17200 Nicotine dependence, unspecified, uncomplicated: Secondary | ICD-10-CM | POA: Insufficient documentation

## 2011-07-20 DIAGNOSIS — M549 Dorsalgia, unspecified: Secondary | ICD-10-CM | POA: Insufficient documentation

## 2011-07-20 DIAGNOSIS — Z760 Encounter for issue of repeat prescription: Secondary | ICD-10-CM | POA: Insufficient documentation

## 2011-07-20 MED ORDER — HYDROCODONE-ACETAMINOPHEN 5-500 MG PO TABS: 1.0000 | ORAL_TABLET | Freq: Four times a day (QID) | ORAL | Status: AC | PRN

## 2011-07-20 NOTE — ED Notes (Signed)
Patient states stomach cramping since her last visit is worse, she states thinks she is dehydrated and needs pain meds.

## 2011-07-20 NOTE — ED Notes (Signed)
Pt amb to room 6 with quick steady gait in nad.  Pt reports being seen last week and dx with back pain/"lymph nodes in my stomach are swollen". Pt states she is still having pain, and wants "just a few more days of the pain medicine".  Pt amb in room, changing channel on tv, in nad.

## 2011-07-20 NOTE — ED Provider Notes (Signed)
History     CSN: [id number] Arrival date & time: 07/20/2011  8:59 AM  Chief Complaint  Patient presents with  . Medication Refill  . Back Pain    (Consider location/radiation/quality/duration/timing/severity/associated sxs/prior treatment) Patient is a 35 y.o. female presenting with back pain. The history is provided by the patient.  Back Pain  This is a recurrent problem. Pertinent negatives include no fever.   pleasant female with known history of degenerative disc disease. Chronic back pain. Patient was seen in the ED 5 days ago for abdominal pain nausea vomiting and diarrhea. She did have a CAT scan at that time which showed mesenteric adenitis.  Her lab work was essentially normal. Patient states that she did go home and had continued vomiting and diarrhea for 3-4 days. However she began feeling much better yesterday. Patient states however that she is having persistent worsening back pain which she attributes to the recent nausea and vomiting. She is requesting extension of the pain meds. She denies any fever headache and is feeling much better she has had no urinary symptoms patient has history of tubal ligation she recently did start her period.  Past Medical History  Diagnosis Date  . Degenerative disk disease     Past Surgical History  Procedure Date  . Cesarean section   . Cholecystectomy   . Exploratory laparotomy   . Hernia repair   . Tubal ligation     History reviewed. No pertinent family history.  History  Substance Use Topics  . Smoking status: Current Everyday Smoker -- 0.7 packs/day for 20 years    Types: Cigarettes  . Smokeless tobacco: Not on file  . Alcohol Use: Yes     occasional use    OB History    Grav Para Term Preterm Abortions TAB SAB Ect Mult Living                  Review of Systems  Constitutional: Negative for fever, diaphoresis and fatigue.  Musculoskeletal: Positive for back pain.  All other systems reviewed and are  negative.    Allergies  Erythromycin and Sulfa antibiotics  Home Medications   Current Outpatient Rx  Name Route Sig Dispense Refill  . ALBUTEROL SULFATE (5 MG/ML) 0.5% IN NEBU Nebulization Take 2.5 mg by nebulization once.      . ALBUTEROL 90 MCG/ACT IN AERS Inhalation Inhale 2 puffs into the lungs as needed. Shortness of breath      . BUPRENORPHINE HCL-NALOXONE HCL 8-2 MG SL FILM Sublingual Place 2 mg under the tongue daily.      Marland Kitchen BUPRENORPHINE HCL-NALOXONE HCL 2-0.5 MG SL SUBL Sublingual Place 1 tablet under the tongue daily.      Marland Kitchen CALCIUM CARBONATE ANTACID 750 MG PO CHEW Oral Chew 3 tablets by mouth every 3 (three) hours as needed. For indigestion     . DIPHENHYDRAMINE HCL 50 MG PO CAPS Oral Take 50 mg by mouth once.      Marland Kitchen EPINEPHRINE 0.3 MG/0.3ML IJ DEVI Intramuscular Inject 0.3 mLs (0.3 mg total) into the muscle as needed. 1 mL 1  . ESOMEPRAZOLE MAGNESIUM 40 MG PO CPDR Oral Take 1 capsule (40 mg total) by mouth daily. 30 capsule 0  . HYDROCODONE-ACETAMINOPHEN 5-500 MG PO TABS Oral Take 1-2 tablets by mouth every 6 (six) hours as needed for pain. 15 tablet 0  . IBUPROFEN 200 MG PO TABS Oral Take 800 mg by mouth once.      Marland Kitchen VICKS SINEX 12 HOUR NA  Nasal Place 1-2 sprays into the nose as needed. Sinus      . RANITIDINE HCL 50 MG/2ML IJ SOLN Intravenous Inject 50 mg into the vein once.      . SUCRALFATE 1 G PO TABS Oral Take 1 tablet (1 g total) by mouth 4 (four) times daily. 30 tablet 0    BP 136/83  Pulse 122  Temp(Src) 98.2 F (36.8 C) (Oral)  Resp 21  Ht 5\' 5"  (1.651 m)  Wt 145 lb (65.772 kg)  BMI 24.13 kg/m2  SpO2 100%  LMP 07/03/2011  Physical Exam  Constitutional: She is oriented to person, place, and time. She appears well-developed and well-nourished.  HENT:  Head: Normocephalic and atraumatic.  Eyes: Conjunctivae and EOM are normal. Pupils are equal, round, and reactive to light.  Neck: Neck supple.  Cardiovascular: Normal rate and regular rhythm.  Exam  reveals no gallop and no friction rub.   No murmur heard. Pulmonary/Chest: Breath sounds normal. She has no wheezes. She has no rales. She exhibits no tenderness.  Abdominal: Soft. Bowel sounds are normal. She exhibits no distension. There is no tenderness. There is no rebound and no guarding.  Musculoskeletal: Normal range of motion.       Mild tenderness in the paralumbar musculature no redness or swelling.  Neurological: She is alert and oriented to person, place, and time. No cranial nerve deficit. Coordination normal.  Skin: Skin is warm and dry. No rash noted.  Psychiatric: She has a normal mood and affect.    ED Course  Procedures (including critical care time)  Labs Reviewed - No data to display No results found.   No diagnosis found.    MDM  Pt is seen and examined;  Initial history and physical completed.  Will follow.          Chalmer Zheng A. Patrica Duel, MD 07/20/11 [phone]

## 2011-08-03 LAB — URINE CULTURE

## 2011-08-03 LAB — URINALYSIS, ROUTINE W REFLEX MICROSCOPIC
Bilirubin Urine: NEGATIVE
Ketones, ur: NEGATIVE
Nitrite: NEGATIVE
Protein, ur: NEGATIVE
Urobilinogen, UA: 0.2

## 2012-02-13 ENCOUNTER — Encounter (HOSPITAL_BASED_OUTPATIENT_CLINIC_OR_DEPARTMENT_OTHER): Payer: Self-pay | Admitting: *Deleted

## 2012-02-13 ENCOUNTER — Emergency Department (HOSPITAL_BASED_OUTPATIENT_CLINIC_OR_DEPARTMENT_OTHER)
Admission: EM | Admit: 2012-02-13 | Discharge: 2012-02-13 | Disposition: A | Discharge status: Home / Self Care | Payer: Self-pay | Attending: Emergency Medicine | Admitting: Emergency Medicine

## 2012-02-13 ENCOUNTER — Emergency Department (INDEPENDENT_AMBULATORY_CARE_PROVIDER_SITE_OTHER): Payer: Self-pay

## 2012-02-13 DIAGNOSIS — M25559 Pain in unspecified hip: Secondary | ICD-10-CM

## 2012-02-13 DIAGNOSIS — F17200 Nicotine dependence, unspecified, uncomplicated: Secondary | ICD-10-CM | POA: Insufficient documentation

## 2012-02-13 DIAGNOSIS — M25552 Pain in left hip: Secondary | ICD-10-CM

## 2012-02-13 DIAGNOSIS — M7989 Other specified soft tissue disorders: Secondary | ICD-10-CM | POA: Insufficient documentation

## 2012-02-13 MED ORDER — HYDROCODONE-ACETAMINOPHEN 5-325 MG PO TABS: 2.0000 | ORAL_TABLET | ORAL | Status: AC | PRN

## 2012-02-13 MED ORDER — CYCLOBENZAPRINE HCL 10 MG PO TABS: 10.0000 mg | ORAL_TABLET | Freq: Two times a day (BID) | ORAL | Status: AC | PRN

## 2012-02-13 MED ORDER — KETOROLAC TROMETHAMINE 60 MG/2ML IM SOLN
60.0000 mg | Freq: Once | INTRAMUSCULAR | Status: AC
  Administered 2012-02-13: 60 mg via INTRAMUSCULAR
  Filled 2012-02-13: qty 2

## 2012-02-13 MED ORDER — METHYLPREDNISOLONE SODIUM SUCC 125 MG IJ SOLR
125.0000 mg | Freq: Once | INTRAMUSCULAR | Status: AC
  Administered 2012-02-13: 125 mg via INTRAMUSCULAR
  Filled 2012-02-13: qty 2

## 2012-02-13 MED ORDER — IBUPROFEN 800 MG PO TABS: 800.0000 mg | ORAL_TABLET | Freq: Three times a day (TID) | ORAL | Status: AC

## 2012-02-13 NOTE — ED Provider Notes (Signed)
Medical screening examination/treatment/procedure(s) were performed by non-physician practitioner and as supervising physician I was immediately available for consultation/collaboration.   Forbes Cellar, MD 02/13/12 2055

## 2012-02-13 NOTE — ED Notes (Signed)
4 days ago left hip was hurting without known injury. Pain has continued to get worse. She has been taking Tylenol and Advil with no relief.

## 2012-02-13 NOTE — ED Notes (Signed)
rx x 3 given for flexeril, norco and ibuprofen at d/c

## 2012-02-13 NOTE — Discharge Instructions (Signed)
Arthralgia Your caregiver has diagnosed you as suffering from an arthralgia. Arthralgia means there is pain in a joint. This can come from many reasons including:  Bruising the joint which causes soreness (inflammation) in the joint.   Wear and tear on the joints which occur as we grow older (osteoarthritis).   Overusing the joint.   Various forms of arthritis.   Infections of the joint.  Regardless of the cause of pain in your joint, most of these different pains respond to anti-inflammatory drugs and rest. The exception to this is when a joint is infected, and these cases are treated with antibiotics, if it is a bacterial infection. HOME CARE INSTRUCTIONS   Rest the injured area for as long as directed by your caregiver. Then slowly start using the joint as directed by your caregiver and as the pain allows. Crutches as directed may be useful if the ankles, knees or hips are involved. If the knee was splinted or casted, continue use and care as directed. If an stretchy or elastic wrapping bandage has been applied today, it should be removed and re-applied every 3 to 4 hours. It should not be applied tightly, but firmly enough to keep swelling down. Watch toes and feet for swelling, bluish discoloration, coldness, numbness or excessive pain. If any of these problems (symptoms) occur, remove the ace bandage and re-apply more loosely. If these symptoms persist, contact your caregiver or return to this location.   For the first 24 hours, keep the injured extremity elevated on pillows while lying down.   Apply ice for 15 to 20 minutes to the sore joint every couple hours while awake for the first half day. Then 3 to 4 times per day for the first 48 hours. Put the ice in a plastic bag and place a towel between the bag of ice and your skin.   Wear any splinting, casting, elastic bandage applications, or slings as instructed.   Only take over-the-counter or prescription medicines for pain,  discomfort, or fever as directed by your caregiver. Do not use aspirin immediately after the injury unless instructed by your physician. Aspirin can cause increased bleeding and bruising of the tissues.   If you were given crutches, continue to use them as instructed and do not resume weight bearing on the sore joint until instructed.  Persistent pain and inability to use the sore joint as directed for more than 2 to 3 days are warning signs indicating that you should see a caregiver for a follow-up visit as soon as possible. Initially, a hairline fracture (break in bone) may not be evident on X-rays. Persistent pain and swelling indicate that further evaluation, non-weight bearing or use of the joint (use of crutches or slings as instructed), or further X-rays are indicated. X-rays may sometimes not show a small fracture until a week or 10 days later. Make a follow-up appointment with your own caregiver or one to whom we have referred you. A radiologist (specialist in reading X-rays) may read your X-rays. Make sure you know how you are to obtain your X-ray results. Do not assume everything is normal if you do not hear from us. SEEK MEDICAL CARE IF: Bruising, swelling, or pain increases. SEEK IMMEDIATE MEDICAL CARE IF:   Your fingers or toes are numb or blue.   The pain is not responding to medications and continues to stay the same or get worse.   The pain in your joint becomes severe.   You develop a fever over   102 F (38.9 C).   It becomes impossible to move or use the joint.  MAKE SURE YOU:   Understand these instructions.   Will watch your condition.   Will get help right away if you are not doing well or get worse.  Document Released: 10/03/2005 Document Revised: 09/22/2011 Document Reviewed: 05/21/2008 [facility] Patient Information 2012 [place], [place].Sciatica Sciatica is a weakness and/or changes in sensation (tingling, jolts, hot and cold, numbness) along the path the sciatic  nerve travels. Irritation or damage to lumbar nerve roots is often also referred to as lumbar radiculopathy.  Lumbar radiculopathy (Sciatica) is the most common form of this problem. Radiculopathy can occur in any of the nerves coming out of the spinal cord. The problems caused depend on which nerves are involved. The sciatic nerve is the large nerve supplying the branches of nerves going from the hip to the toes. It often causes a numbness or weakness in the skin and/or muscles that the sciatic nerve serves. It also may cause symptoms (problems) of pain, burning, tingling, or electric shock-like feelings in the path of this nerve. This usually comes from injury to the fibers that make up the sciatic nerve. Some of these symptoms are low back pain and/or unpleasant feelings in the following areas:  From the mid-buttock down the back of the leg to the back of the knee.   And/or the outside of the calf and top of the foot.   And/or behind the inner ankle to the sole of the foot.  CAUSES   Herniated or slipped disc. Discs are the little cushions between the bones in the back.   Pressure by the piriformis muscle in the buttock on the sciatic nerve (Piriformis Syndrome).   Misalignment of the bones in the lower back and buttocks (Sacroiliac Joint Derangement).   Narrowing of the spinal canal that puts pressure on or pinches the fibers that make up the sciatic nerve.   A slipped vertebra that is out of line with those above or beneath it.   Abnormality of the nervous system itself so that nerve fibers do not transmit signals properly, especially to feet and calves (neuropathy).   Tumor (this is rare).  Your caregiver can usually determine the cause of your sciatica and begin the treatment most likely to help you. TREATMENT  Taking over-the-counter painkillers, physical therapy, rest, exercise, spinal manipulation, and injections of anesthetics and/or steroids may be used. Surgery, acupuncture, and  Yoga can also be effective. Mind over matter techniques, mental imagery, and changing factors such as your bed, chair, desk height, posture, and activities are other treatments that may be helpful. You and your caregiver can help determine what is best for you. With proper diagnosis, the cause of most sciatica can be identified and removed. Communication and cooperation between your caregiver and you is essential. If you are not successful immediately, do not be discouraged. With time, a proper treatment can be found that will make you comfortable. HOME CARE INSTRUCTIONS   If the pain is coming from a problem in the back, applying ice to that area for 15 to 20 minutes, 3 to 4 times per day while awake, may be helpful. Put the ice in a plastic bag. Place a towel between the bag of ice and your skin.   You may exercise or perform your usual activities if these do not aggravate your pain, or as suggested by your caregiver.   Only take over-the-counter or prescription medicines for pain, discomfort, or  fever as directed by your caregiver.   If your caregiver has given you a follow-up appointment, it is very important to keep that appointment. Not keeping the appointment could result in a chronic or permanent injury, pain, and disability. If there is any problem keeping the appointment, you must call back to this facility for assistance.  SEEK IMMEDIATE MEDICAL CARE IF:   You experience loss of control of bowel or bladder.   You have increasing weakness in the trunk, buttocks, or legs.   There is numbness in any areas from the hip down to the toes.   You have difficulty walking or keeping your balance.   You have any of the above, with fever or forceful vomiting.  Document Released: 09/27/2001 Document Revised: 09/22/2011 Document Reviewed: 05/16/2008 [facility] Patient Information 2012 [place], [place].

## 2012-02-13 NOTE — ED Provider Notes (Signed)
History     CSN: [id number]  Arrival date & time 02/13/12  1725   First MD Initiated Contact with Patient 02/13/12 1850      No chief complaint on file.   (Consider location/radiation/quality/duration/timing/severity/associated sxs/prior treatment) Patient is a 36 y.o. female presenting with hip pain. The history is provided by the patient. No language interpreter was used.  Hip Pain This is a new problem. The current episode started in the past 7 days. The problem occurs constantly. The problem has been gradually worsening. Associated symptoms include joint swelling and myalgias. Pertinent negatives include no abdominal pain, chest pain or fever. The symptoms are aggravated by bending and exertion. She has tried NSAIDs for the symptoms. The treatment provided no relief.   Pt complains of pain in her left hip.  Pt reports she was playing catcher before pain started. Past Medical History  Diagnosis Date  . Degenerative disk disease     Past Surgical History  Procedure Date  . Cesarean section   . Cholecystectomy   . Exploratory laparotomy   . Hernia repair   . Tubal ligation     No family history on file.  History  Substance Use Topics  . Smoking status: Current Everyday Smoker -- 0.7 packs/day for 20 years    Types: Cigarettes  . Smokeless tobacco: Not on file  . Alcohol Use: Yes     occasional use    OB History    Grav Para Term Preterm Abortions TAB SAB Ect Mult Living                  Review of Systems  Constitutional: Negative for fever.  Cardiovascular: Negative for chest pain.  Gastrointestinal: Negative for abdominal pain.  Musculoskeletal: Positive for myalgias, back pain, joint swelling and gait problem.  All other systems reviewed and are negative.    Allergies  Erythromycin and Sulfa antibiotics  Home Medications   Current Outpatient Rx  Name Route Sig Dispense Refill  . ACETAMINOPHEN 500 MG PO TABS Oral Take 500 mg by mouth every 6 (six)  hours as needed. Patient used this medication for her hip pain.    . IBUPROFEN 200 MG PO TABS Oral Take 800 mg by mouth once. Patient used this medication for her hip pain.    . ALBUTEROL SULFATE (5 MG/ML) 0.5% IN NEBU Nebulization Take 2.5 mg by nebulization once.      . ALBUTEROL 90 MCG/ACT IN AERS Inhalation Inhale 2 puffs into the lungs as needed. Shortness of breath     . EPINEPHRINE 0.3 MG/0.3ML IJ DEVI Intramuscular Inject 0.3 mLs (0.3 mg total) into the muscle as needed. 1 mL 1    BP 150/96  Pulse 98  Temp(Src) 98 F (36.7 C) (Oral)  Resp 18  Ht 5\' 5"  (1.651 m)  Wt 145 lb (65.772 kg)  BMI 24.13 kg/m2  SpO2 100%  LMP 02/13/2012  Physical Exam  Constitutional: She is oriented to person, place, and time. She appears well-developed and well-nourished.  HENT:  Head: Normocephalic.  Neck: Normal range of motion.  Pulmonary/Chest: Effort normal.  Abdominal: Soft.  Musculoskeletal: She exhibits tenderness.  Neurological: She is alert and oriented to person, place, and time. She has normal reflexes.  Skin: Skin is warm.  Psychiatric: She has a normal mood and affect.    ED Course  Procedures (including critical care time)  Labs Reviewed - No data to display Dg Hip Complete Left  02/13/2012  *RADIOLOGY REPORT*  Clinical Data:  Left hip pain for 4 days, no injury  LEFT HIP - COMPLETE 2+ VIEW  Comparison: None.  Findings:  No fracture or dislocation.  Joint spaces are preserved. Incidental note is made of an os acetabulae.  Limited visualization of the contralateral right hip is normal. Limited visualization of the bilateral SI joints and pubic symphysis is normal.  Limited visualization of the lumbar spine is normal.  Regional soft tissues are normal.  IMPRESSION: Normal radiographs of the left hip.  Original Report Authenticated By: Waynard Reeds, M.D.     No diagnosis found.    MDM  Pt given Solumedrol and Torodol Im.   Xray left hip normal,  I advised follow up with  primary MD for recheck,  Return if any problems.       Lonia Skinner [place], [place] 02/13/12 1954

## 2012-06-20 ENCOUNTER — Encounter (HOSPITAL_BASED_OUTPATIENT_CLINIC_OR_DEPARTMENT_OTHER): Payer: Self-pay | Admitting: Family Medicine

## 2012-06-20 ENCOUNTER — Emergency Department (HOSPITAL_BASED_OUTPATIENT_CLINIC_OR_DEPARTMENT_OTHER)
Admission: EM | Admit: 2012-06-20 | Discharge: 2012-06-20 | Disposition: A | Discharge status: Home / Self Care | Payer: Self-pay | Attending: Emergency Medicine | Admitting: Emergency Medicine

## 2012-06-20 DIAGNOSIS — Z882 Allergy status to sulfonamides status: Secondary | ICD-10-CM | POA: Insufficient documentation

## 2012-06-20 DIAGNOSIS — F17200 Nicotine dependence, unspecified, uncomplicated: Secondary | ICD-10-CM | POA: Insufficient documentation

## 2012-06-20 DIAGNOSIS — Z881 Allergy status to other antibiotic agents status: Secondary | ICD-10-CM | POA: Insufficient documentation

## 2012-06-20 DIAGNOSIS — K089 Disorder of teeth and supporting structures, unspecified: Secondary | ICD-10-CM | POA: Insufficient documentation

## 2012-06-20 DIAGNOSIS — K0889 Other specified disorders of teeth and supporting structures: Secondary | ICD-10-CM

## 2012-06-20 MED ORDER — HYDROCODONE-ACETAMINOPHEN 5-325 MG PO TABS: 2.0000 | ORAL_TABLET | ORAL | Status: AC | PRN

## 2012-06-20 MED ORDER — AMOXICILLIN 500 MG PO CAPS: 500.0000 mg | ORAL_CAPSULE | Freq: Three times a day (TID) | ORAL | Status: AC

## 2012-06-20 NOTE — ED Provider Notes (Signed)
History     CSN: [id number]  Arrival date & time 06/20/12  1234   First MD Initiated Contact with Patient 06/20/12 1301      Chief Complaint  Patient presents with  . Dental Pain    (Consider location/radiation/quality/duration/timing/severity/associated sxs/prior treatment) Patient is a 36 y.o. female presenting with tooth pain. The history is provided by the patient. No language interpreter was used.  Dental PainThe primary symptoms include mouth pain and dental injury. The symptoms began 6 to 12 hours ago. The symptoms are worsening. The symptoms are new.  Additional symptoms do not include: gum swelling.  Pt complains of a toothache upper mouth.  Pt reports a piece of tooth broke off today  Past Medical History  Diagnosis Date  . Degenerative disk disease     Past Surgical History  Procedure Date  . Cesarean section   . Cholecystectomy   . Exploratory laparotomy   . Hernia repair   . Tubal ligation     No family history on file.  History  Substance Use Topics  . Smoking status: Current Everyday Smoker -- 0.7 packs/day for 20 years    Types: Cigarettes  . Smokeless tobacco: Not on file  . Alcohol Use: Yes     occasional use    OB History    Grav Para Term Preterm Abortions TAB SAB Ect Mult Living                  Review of Systems  All other systems reviewed and are negative.    Allergies  Erythromycin and Sulfa antibiotics  Home Medications   Current Outpatient Rx  Name Route Sig Dispense Refill  . ACETAMINOPHEN 500 MG PO TABS Oral Take 500 mg by mouth every 6 (six) hours as needed. Patient used this medication for her hip pain.    . ALBUTEROL SULFATE (5 MG/ML) 0.5% IN NEBU Nebulization Take 2.5 mg by nebulization once.      . ALBUTEROL 90 MCG/ACT IN AERS Inhalation Inhale 2 puffs into the lungs as needed. Shortness of breath     . EPINEPHRINE 0.3 MG/0.3ML IJ DEVI Intramuscular Inject 0.3 mLs (0.3 mg total) into the muscle as needed. 1 mL 1  .  IBUPROFEN 200 MG PO TABS Oral Take 800 mg by mouth once. Patient used this medication for her hip pain.      BP 159/104  Pulse 93  Temp 98.3 F (36.8 C) (Oral)  Resp 16  SpO2 99%  LMP 05/30/2012  Physical Exam  Nursing note and vitals reviewed. Constitutional: She is oriented to person, place, and time. She appears well-developed and well-nourished.  HENT:  Head: Normocephalic and atraumatic.       Broken left 1st molar  Eyes: Conjunctivae and EOM are normal. Pupils are equal, round, and reactive to light.  Neck: Normal range of motion.  Cardiovascular: Normal rate and regular rhythm.   Pulmonary/Chest: Effort normal.  Abdominal: Soft.  Musculoskeletal: Normal range of motion.  Neurological: She is alert and oriented to person, place, and time. She has normal reflexes.  Skin: Skin is warm.  Psychiatric: She has a normal mood and affect.    ED Course  Procedures (including critical care time)  Labs Reviewed - No data to display No results found.   No diagnosis found.    MDM      amoxicillian and hydrocodone.  Call Dr. Burgess Estelle dentist to be seen    Elson Areas, [place] 06/20/12 1359

## 2012-06-20 NOTE — ED Provider Notes (Signed)
Medical screening examination/treatment/procedure(s) were performed by non-physician practitioner and as supervising physician I was immediately available for consultation/collaboration.   Glynn Octave, MD 06/20/12 1430

## 2012-06-20 NOTE — ED Notes (Signed)
Pt c/o broken tooth today to left upper. Pt sts she took ibuprofen prior to arrival. Pt sts she does not have a dentist.

## 2012-11-09 ENCOUNTER — Emergency Department (HOSPITAL_BASED_OUTPATIENT_CLINIC_OR_DEPARTMENT_OTHER)
Admission: EM | Admit: 2012-11-09 | Discharge: 2012-11-09 | Disposition: A | Discharge status: Home / Self Care | Payer: Self-pay | Attending: Emergency Medicine | Admitting: Emergency Medicine

## 2012-11-09 ENCOUNTER — Encounter (HOSPITAL_BASED_OUTPATIENT_CLINIC_OR_DEPARTMENT_OTHER): Payer: Self-pay

## 2012-11-09 DIAGNOSIS — Z8739 Personal history of other diseases of the musculoskeletal system and connective tissue: Secondary | ICD-10-CM | POA: Insufficient documentation

## 2012-11-09 DIAGNOSIS — Z79899 Other long term (current) drug therapy: Secondary | ICD-10-CM | POA: Insufficient documentation

## 2012-11-09 DIAGNOSIS — K089 Disorder of teeth and supporting structures, unspecified: Secondary | ICD-10-CM | POA: Insufficient documentation

## 2012-11-09 DIAGNOSIS — F17200 Nicotine dependence, unspecified, uncomplicated: Secondary | ICD-10-CM | POA: Insufficient documentation

## 2012-11-09 DIAGNOSIS — K0889 Other specified disorders of teeth and supporting structures: Secondary | ICD-10-CM

## 2012-11-09 MED ORDER — PENICILLIN V POTASSIUM 250 MG PO TABS: 250.0000 mg | ORAL_TABLET | Freq: Four times a day (QID) | ORAL | Status: AC

## 2012-11-09 MED ORDER — HYDROCODONE-ACETAMINOPHEN 5-325 MG PO TABS: 2.0000 | ORAL_TABLET | ORAL | Status: AC | PRN

## 2012-11-09 NOTE — ED Notes (Signed)
NP at bedside.

## 2012-11-09 NOTE — ED Notes (Signed)
States she broke left upper tooth today

## 2012-11-09 NOTE — ED Provider Notes (Signed)
History     CSN: [id number]  Arrival date & time 11/09/12  1816   First MD Initiated Contact with Patient 11/09/12 1832      Chief Complaint  Patient presents with  . Dental Pain    (Consider location/radiation/quality/duration/timing/severity/associated sxs/prior treatment) HPI Comments: Pt states that she had previously fracture part of the tooth but today she broke it further  Patient is a 37 y.o. female presenting with tooth pain. The history is provided by the patient. No language interpreter was used.  Dental PainThe primary symptoms include dental injury. The symptoms began 6 to 12 hours ago. The symptoms are unchanged. The symptoms are recurrent.    Past Medical History  Diagnosis Date  . Degenerative disk disease     Past Surgical History  Procedure Date  . Cesarean section   . Cholecystectomy   . Exploratory laparotomy   . Hernia repair   . Tubal ligation     No family history on file.  History  Substance Use Topics  . Smoking status: Current Every Day Smoker -- 0.7 packs/day for 20 years    Types: Cigarettes  . Smokeless tobacco: Not on file  . Alcohol Use: Yes    OB History    Grav Para Term Preterm Abortions TAB SAB Ect Mult Living                  Review of Systems  Constitutional: Negative.   Respiratory: Negative.   Cardiovascular: Negative.     Allergies  Erythromycin and Sulfa antibiotics  Home Medications   Current Outpatient Rx  Name  Route  Sig  Dispense  Refill  . ACETAMINOPHEN 500 MG PO TABS   Oral   Take 500 mg by mouth every 6 (six) hours as needed. Patient used this medication for her hip pain.         . ALBUTEROL SULFATE (5 MG/ML) 0.5% IN NEBU   Nebulization   Take 2.5 mg by nebulization once.           . ALBUTEROL 90 MCG/ACT IN AERS   Inhalation   Inhale 2 puffs into the lungs as needed. Shortness of breath          . EPINEPHRINE 0.3 MG/0.3ML IJ DEVI   Intramuscular   Inject 0.3 mLs (0.3 mg total) into  the muscle as needed.   1 mL   1   . HYDROCODONE-ACETAMINOPHEN 5-325 MG PO TABS   Oral   Take 2 tablets by mouth every 4 (four) hours as needed for pain.   10 tablet   0   . IBUPROFEN 200 MG PO TABS   Oral   Take 800 mg by mouth once. Patient used this medication for her hip pain.         Marland Kitchen PENICILLIN V POTASSIUM 250 MG PO TABS   Oral   Take 1 tablet (250 mg total) by mouth 4 (four) times daily.   40 tablet   0     BP 132/81  Pulse 87  Temp 98.3 F (36.8 C) (Oral)  Resp 16  Ht 5\' 5"  (1.651 m)  Wt 140 lb (63.504 kg)  BMI 23.30 kg/m2  SpO2 100%  LMP 10/29/2012  Physical Exam  Nursing note and vitals reviewed. Constitutional: She is oriented to person, place, and time. She appears well-developed and well-nourished.  HENT:  Right Ear: External ear normal.  Left Ear: External ear normal.  Mouth/Throat:    Cardiovascular: Normal rate and regular  rhythm.   Pulmonary/Chest: Effort normal and breath sounds normal.  Musculoskeletal: Normal range of motion.  Neurological: She is alert and oriented to person, place, and time.    ED Course  Procedures (including critical care time)  Labs Reviewed - No data to display No results found.   1. Toothache       MDM  Will treat with pcn and vicodin:pt given dental referral         Teressa Lower, NP 11/09/12 1901

## 2012-11-09 NOTE — ED Provider Notes (Signed)
Medical screening examination/treatment/procedure(s) were performed by non-physician practitioner and as supervising physician I was immediately available for consultation/collaboration.   Carleene Cooper III, MD 11/09/12 2008

## 2013-09-02 ENCOUNTER — Emergency Department (HOSPITAL_BASED_OUTPATIENT_CLINIC_OR_DEPARTMENT_OTHER): Payer: Self-pay

## 2013-09-02 ENCOUNTER — Emergency Department (HOSPITAL_BASED_OUTPATIENT_CLINIC_OR_DEPARTMENT_OTHER)
Admission: EM | Admit: 2013-09-02 | Discharge: 2013-09-02 | Disposition: A | Discharge status: Home / Self Care | Payer: Self-pay | Attending: Emergency Medicine | Admitting: Emergency Medicine

## 2013-09-02 ENCOUNTER — Encounter (HOSPITAL_BASED_OUTPATIENT_CLINIC_OR_DEPARTMENT_OTHER): Payer: Self-pay | Admitting: Emergency Medicine

## 2013-09-02 DIAGNOSIS — Z8739 Personal history of other diseases of the musculoskeletal system and connective tissue: Secondary | ICD-10-CM | POA: Insufficient documentation

## 2013-09-02 DIAGNOSIS — Z792 Long term (current) use of antibiotics: Secondary | ICD-10-CM | POA: Insufficient documentation

## 2013-09-02 DIAGNOSIS — N898 Other specified noninflammatory disorders of vagina: Secondary | ICD-10-CM | POA: Insufficient documentation

## 2013-09-02 DIAGNOSIS — N390 Urinary tract infection, site not specified: Secondary | ICD-10-CM

## 2013-09-02 DIAGNOSIS — Z9851 Tubal ligation status: Secondary | ICD-10-CM | POA: Insufficient documentation

## 2013-09-02 DIAGNOSIS — Z3202 Encounter for pregnancy test, result negative: Secondary | ICD-10-CM | POA: Insufficient documentation

## 2013-09-02 DIAGNOSIS — R110 Nausea: Secondary | ICD-10-CM | POA: Insufficient documentation

## 2013-09-02 DIAGNOSIS — R109 Unspecified abdominal pain: Secondary | ICD-10-CM

## 2013-09-02 DIAGNOSIS — Z9089 Acquired absence of other organs: Secondary | ICD-10-CM | POA: Insufficient documentation

## 2013-09-02 DIAGNOSIS — Z79899 Other long term (current) drug therapy: Secondary | ICD-10-CM | POA: Insufficient documentation

## 2013-09-02 DIAGNOSIS — B9689 Other specified bacterial agents as the cause of diseases classified elsewhere: Secondary | ICD-10-CM

## 2013-09-02 DIAGNOSIS — Z8632 Personal history of gestational diabetes: Secondary | ICD-10-CM | POA: Insufficient documentation

## 2013-09-02 DIAGNOSIS — R1033 Periumbilical pain: Secondary | ICD-10-CM | POA: Insufficient documentation

## 2013-09-02 DIAGNOSIS — N760 Acute vaginitis: Secondary | ICD-10-CM | POA: Insufficient documentation

## 2013-09-02 DIAGNOSIS — A499 Bacterial infection, unspecified: Secondary | ICD-10-CM | POA: Insufficient documentation

## 2013-09-02 DIAGNOSIS — F17200 Nicotine dependence, unspecified, uncomplicated: Secondary | ICD-10-CM | POA: Insufficient documentation

## 2013-09-02 HISTORY — DX: Gestational diabetes mellitus in pregnancy, unspecified control: O24.419

## 2013-09-02 LAB — COMPREHENSIVE METABOLIC PANEL
ALT: 24 U/L (ref 0–35)
AST: 20 U/L (ref 0–37)
Albumin: 3.9 g/dL (ref 3.5–5.2)
Alkaline Phosphatase: 64 U/L (ref 39–117)
BUN: 11 mg/dL (ref 6–23)
CO2: 22 mEq/L (ref 19–32)
Calcium: 9.3 mg/dL (ref 8.4–10.5)
Chloride: 100 mEq/L (ref 96–112)
Creatinine, Ser: 0.7 mg/dL (ref 0.50–1.10)
GFR calc Af Amer: >90 mL/min (ref >90)
GFR calc non Af Amer: >90 mL/min (ref >90)
Glucose, Bld: 87 mg/dL (ref 70–99)
Potassium: 3.7 mEq/L (ref 3.5–5.1)
Sodium: 136 mEq/L (ref 135–145)
Total Bilirubin: 0.2 mg/dL — ABNORMAL LOW (ref 0.3–1.2)
Total Protein: 7.6 g/dL (ref 6.0–8.3)

## 2013-09-02 LAB — CBC WITH DIFFERENTIAL/PLATELET
Basophils Absolute: 0 10*3/uL (ref 0.0–0.1)
Basophils Relative: 0 % (ref 0–1)
Eosinophils Absolute: 0.1 10*3/uL (ref 0.0–0.7)
Eosinophils Relative: 1 % (ref 0–5)
HCT: 41.1 % (ref 36.0–46.0)
Hemoglobin: 14 g/dL (ref 12.0–15.0)
Lymphocytes Relative: 34 % (ref 12–46)
Lymphs Abs: 4 10*3/uL (ref 0.7–4.0)
MCH: 32.4 pg (ref 26.0–34.0)
MCHC: 34.1 g/dL (ref 30.0–36.0)
MCV: 95.1 fL (ref 78.0–100.0)
Monocytes Absolute: 0.7 10*3/uL (ref 0.1–1.0)
Monocytes Relative: 6 % (ref 3–12)
Neutro Abs: 6.9 10*3/uL (ref 1.7–7.7)
Neutrophils Relative %: 59 % (ref 43–77)
Platelets: 203 10*3/uL (ref 150–400)
RBC: 4.32 MIL/uL (ref 3.87–5.11)
RDW: 12.5 % (ref 11.5–15.5)
WBC: 11.7 10*3/uL — ABNORMAL HIGH (ref 4.0–10.5)

## 2013-09-02 LAB — URINALYSIS, ROUTINE W REFLEX MICROSCOPIC
Leukocytes, UA: NEGATIVE
Protein, ur: NEGATIVE mg/dL
Urobilinogen, UA: 0.2 mg/dL (ref 0.0–1.0)

## 2013-09-02 LAB — LIPASE, BLOOD: Lipase: 36 U/L (ref 11–59)

## 2013-09-02 LAB — WET PREP, GENITAL
Trich, Wet Prep: NONE SEEN
Yeast Wet Prep HPF POC: NONE SEEN

## 2013-09-02 LAB — URINE MICROSCOPIC-ADD ON

## 2013-09-02 MED ORDER — METRONIDAZOLE 500 MG PO TABS
500.0000 mg | ORAL_TABLET | Freq: Once | ORAL | Status: AC
  Administered 2013-09-02: 500 mg via ORAL
  Filled 2013-09-02: qty 1

## 2013-09-02 MED ORDER — CIPROFLOXACIN HCL 500 MG PO TABS
500.0000 mg | ORAL_TABLET | Freq: Once | ORAL | Status: AC
  Administered 2013-09-02: 500 mg via ORAL
  Filled 2013-09-02: qty 1

## 2013-09-02 MED ORDER — IBUPROFEN 600 MG PO TABS: 600.0000 mg | ORAL_TABLET | Freq: Four times a day (QID) | ORAL | Status: DC | PRN

## 2013-09-02 MED ORDER — OXYCODONE-ACETAMINOPHEN 5-325 MG PO TABS: 1.0000 | ORAL_TABLET | Freq: Four times a day (QID) | ORAL | Status: AC | PRN

## 2013-09-02 MED ORDER — OXYCODONE-ACETAMINOPHEN 5-325 MG PO TABS
2.0000 | ORAL_TABLET | Freq: Once | ORAL | Status: AC
  Administered 2013-09-02: 2 via ORAL
  Filled 2013-09-02: qty 2

## 2013-09-02 MED ORDER — HYDROMORPHONE HCL PF 1 MG/ML IJ SOLN
1.0000 mg | Freq: Once | INTRAMUSCULAR | Status: AC
  Administered 2013-09-02: 1 mg via INTRAVENOUS
  Filled 2013-09-02: qty 1

## 2013-09-02 MED ORDER — METRONIDAZOLE 500 MG PO TABS: 500.0000 mg | ORAL_TABLET | Freq: Two times a day (BID) | ORAL | Status: AC

## 2013-09-02 MED ORDER — CIPROFLOXACIN HCL 500 MG PO TABS: 500.0000 mg | ORAL_TABLET | Freq: Two times a day (BID) | ORAL | Status: AC

## 2013-09-02 NOTE — ED Notes (Signed)
PA at bedside.

## 2013-09-02 NOTE — ED Notes (Signed)
Reports RLQ pain x "a couple months" - states pain worse over last 2 days- states has not seen MD for pain

## 2013-09-02 NOTE — ED Provider Notes (Signed)
CSN: [id number]     Arrival date & time 09/02/13  1857 History   First MD Initiated Contact with Patient 09/02/13 1909     Chief Complaint  Patient presents with  . Abdominal Pain   (Consider location/radiation/quality/duration/timing/severity/associated sxs/prior Treatment) HPI Pt is a 37yo female c/o RLQ pain that has been intermittent x51mo but greatly worsened over the last 2 days.  Reports abdominal pain has been in umbilical region but recently started radiating into suprapubic and RLQ region.  Pain is constant, dull soreness with occasional sharp stabbing pain, 9/10 at worst.  Reports holding her side where pain is helps ease pain but makes pain worse when she lets go. Has tried Advil and aleve with minimal relief. Reports some nausea but no fever, vomiting or diarrhea. Reports having cholecystectomy, hernia repair, exploratory laparotomy for endometreosis, and tubal ligation.  Pt still has appendix.  Denies recent trauma to abdomen. Denies hx of kidney stones. Reports urgency with urination x52mo but no burning with urination or hematuria. Denies vaginal symptoms.   Past Medical History  Diagnosis Date  . Degenerative disk disease   . Gestational diabetes    Past Surgical History  Procedure Laterality Date  . Cesarean section    . Cholecystectomy    . Exploratory laparotomy    . Hernia repair    . Tubal ligation     No family history on file. History  Substance Use Topics  . Smoking status: Current Every Day Smoker -- 0.75 packs/day for 20 years    Types: Cigarettes  . Smokeless tobacco: Never Used  . Alcohol Use: Yes     Comment: Rare   OB History   Grav Para Term Preterm Abortions TAB SAB Ect Mult Living                 Review of Systems  Constitutional: Negative for fever and chills.  Gastrointestinal: Positive for nausea and abdominal pain. Negative for vomiting, diarrhea and constipation.  Genitourinary: Positive for urgency. Negative for dysuria, flank pain,  vaginal bleeding, vaginal discharge, enuresis and vaginal pain.  All other systems reviewed and are negative.    Allergies  Erythromycin and Sulfa antibiotics  Home Medications   Current Outpatient Rx  Name  Route  Sig  Dispense  Refill  . acetaminophen (TYLENOL) 500 MG tablet   Oral   Take 500 mg by mouth every 6 (six) hours as needed. Patient used this medication for her hip pain.         Marland Kitchen albuterol (PROVENTIL,VENTOLIN) 90 MCG/ACT inhaler   Inhalation   Inhale 2 puffs into the lungs as needed. Shortness of breath          . EPINEPHrine (EPIPEN) 0.3 mg/0.3 mL DEVI   Intramuscular   Inject 0.3 mLs (0.3 mg total) into the muscle as needed.   1 mL   1   . ibuprofen (ADVIL,MOTRIN) 200 MG tablet   Oral   Take 800 mg by mouth once. Patient used this medication for her hip pain.         Marland Kitchen albuterol (PROVENTIL) (5 MG/ML) 0.5% nebulizer solution   Nebulization   Take 2.5 mg by nebulization once.           . ciprofloxacin (CIPRO) 500 MG tablet   Oral   Take 1 tablet (500 mg total) by mouth 2 (two) times daily.   10 tablet   0   . HYDROcodone-acetaminophen (NORCO/VICODIN) 5-325 MG per tablet   Oral  Take 2 tablets by mouth every 4 (four) hours as needed for pain.   10 tablet   0   . ibuprofen (ADVIL,MOTRIN) 600 MG tablet   Oral   Take 1 tablet (600 mg total) by mouth every 6 (six) hours as needed.   30 tablet   0   . metroNIDAZOLE (FLAGYL) 500 MG tablet   Oral   Take 1 tablet (500 mg total) by mouth 2 (two) times daily.   14 tablet   0   . oxyCODONE-acetaminophen (PERCOCET/ROXICET) 5-325 MG per tablet   Oral   Take 1 tablet by mouth every 6 (six) hours as needed for severe pain. May take 2 tablets PO q 6 hours for severe pain - Do not take with Tylenol as this tablet already contains tylenol   10 tablet   0    BP 154/84  Pulse 87  Temp(Src) 98.5 F (36.9 C) (Oral)  Resp 20  Ht 5\' 5"  (1.651 m)  Wt 140 lb 7 oz (63.702 kg)  BMI 23.37 kg/m2  SpO2  100%  LMP 08/12/2013 Physical Exam  Nursing note and vitals reviewed. Constitutional: She appears well-developed and well-nourished. No distress.  HENT:  Head: Normocephalic and atraumatic.  Eyes: Conjunctivae are normal. No scleral icterus.  Neck: Normal range of motion.  Cardiovascular: Normal rate, regular rhythm and normal heart sounds.   Pulmonary/Chest: Effort normal and breath sounds normal. No respiratory distress. She has no wheezes. She has no rales. She exhibits no tenderness.  Abdominal: Soft. Bowel sounds are normal. She exhibits no distension and no mass. There is tenderness. There is rebound ( over RLQ) and guarding ( reflexive tightening of abdominal muscles).  Soft, non-distended. Tender in umbilical, suprapubic, and RLQ region with rebound and guarding.  Genitourinary: Uterus normal. Pelvic exam was performed with patient supine. No labial fusion. There is no rash, tenderness or lesion on the right labia. There is no rash, tenderness, lesion or injury on the left labia. Cervix exhibits no motion tenderness, no discharge and no friability. Right adnexum displays tenderness. Right adnexum displays no mass and no fullness. Left adnexum displays no mass, no tenderness and no fullness. No erythema, tenderness or bleeding around the vagina. No foreign body around the vagina. No signs of injury around the vagina. Vaginal discharge found.  Scant amount white vaginal discharged. Right adnexal tenderness but no CMT adnexal masses.   Musculoskeletal: Normal range of motion.  Neurological: She is alert.  Skin: Skin is warm and dry. She is not diaphoretic.    ED Course  Procedures (including critical care time) Labs Review Labs Reviewed  WET PREP, GENITAL - Abnormal; Notable for the following:    Clue Cells Wet Prep HPF POC MANY (*)    WBC, Wet Prep HPF POC FEW (*)    All other components within normal limits  URINALYSIS, ROUTINE W REFLEX MICROSCOPIC - Abnormal; Notable for the  following:    APPearance CLOUDY (*)    Nitrite POSITIVE (*)    All other components within normal limits  CBC WITH DIFFERENTIAL - Abnormal; Notable for the following:    WBC 11.7 (*)    All other components within normal limits  COMPREHENSIVE METABOLIC PANEL - Abnormal; Notable for the following:    Total Bilirubin 0.2 (*)    All other components within normal limits  URINE MICROSCOPIC-ADD ON - Abnormal; Notable for the following:    Squamous Epithelial / LPF FEW (*)    Bacteria, UA MANY (*)  All other components within normal limits  GC/CHLAMYDIA PROBE AMP  PREGNANCY, URINE  LIPASE, BLOOD   Imaging Review Ct Abdomen Pelvis Wo Contrast  09/02/2013   CLINICAL DATA:  Left lower quadrant abdominal pain and nausea.  EXAM: CT ABDOMEN AND PELVIS WITHOUT CONTRAST  TECHNIQUE: Multidetector CT imaging of the abdomen and pelvis was performed following the standard protocol without intravenous contrast.  COMPARISON:  07/16/2011  FINDINGS: The noncontrast CT appearance of the liver, spleen, pancreas, and adrenal glands is within normal limits. Gallbladder surgically absent.  Kidneys and proximal ureters unremarkable. No hydroureter noted. Vascular calcifications noted in the pelvis, some of which are new. No definite distal ureteral calculus identified.  Uterine and adnexal contours unremarkable. Mild sclerosis along both sacroiliac joints noted. Irregularity noted along the right upper sacrum, but no change from 2012 to suggest that this is an acute abnormality. Lower lumbar degenerative facet arthropathy. Appendix not well seen.  Scattered air-fluid levels in nondilated left-sided small bowel.  IMPRESSION: 1. Scattered air-fluid levels in nondilated left-sided small bowel. Appearance is nonspecific but may reflect enteritis or mild small bowel ileus. 2.  Prominent stool throughout the colon favors constipation. 3. Appendix not well seen, low ring negative predictive value for appendicitis. 4. No renal  or ureteral calculi identified although there are new vascular calcifications in the pelvis near the distal ureters.   Electronically Signed   By: Herbie Baltimore M.D.   On: 09/02/2013 20:06    EKG Interpretation   None       MDM   1. UTI (lower urinary tract infection)   2. BV (bacterial vaginosis)   3. Abdominal pain    Concern for appendicitis, SBO, ileus, diverticulitis, UTI.  Labs ordered:  CBC, CMP, lipase, UA, urine pregnancy.   UA: evidence of UTI  CT abd: low evidence for appendicitis.  Does show prominent stool throughout colon suggestive of constipation.  Pelvic exam: right adnexal tenderness. Scant white vaginal discharge.  No masses palpation.   Wet prep: many clue cells, will tx for BV  Will tx for UTI and BV. Advised pt to f/u in 3-4 days with GYN for further evaluation if pain is not improving or symptoms begin to worsen.  Rx: cipro and flagyl. Percocet for abdominal pain.  Return precautions provided. Pt verbalized understanding and agreement with tx plan.    Junius Finner, PA-C 09/02/13 2328

## 2013-09-02 NOTE — ED Provider Notes (Signed)
Medical screening examination/treatment/procedure(s) were performed by non-physician practitioner and as supervising physician I was immediately available for consultation/collaboration.  EKG Interpretation   None         Jermaine Neuharth, MD 09/02/13 2330 

## 2013-09-03 LAB — GC/CHLAMYDIA PROBE AMP
CT Probe RNA: NEGATIVE
GC Probe RNA: NEGATIVE

## 2013-11-17 ENCOUNTER — Emergency Department (HOSPITAL_BASED_OUTPATIENT_CLINIC_OR_DEPARTMENT_OTHER)
Admission: EM | Admit: 2013-11-17 | Discharge: 2013-11-17 | Disposition: A | Discharge status: Home / Self Care | Payer: Self-pay | Attending: Emergency Medicine | Admitting: Emergency Medicine

## 2013-11-17 ENCOUNTER — Encounter (HOSPITAL_BASED_OUTPATIENT_CLINIC_OR_DEPARTMENT_OTHER): Payer: Self-pay | Admitting: Emergency Medicine

## 2013-11-17 ENCOUNTER — Emergency Department (HOSPITAL_BASED_OUTPATIENT_CLINIC_OR_DEPARTMENT_OTHER): Payer: Self-pay

## 2013-11-17 DIAGNOSIS — Z791 Long term (current) use of non-steroidal anti-inflammatories (NSAID): Secondary | ICD-10-CM | POA: Insufficient documentation

## 2013-11-17 DIAGNOSIS — Z8742 Personal history of other diseases of the female genital tract: Secondary | ICD-10-CM | POA: Insufficient documentation

## 2013-11-17 DIAGNOSIS — Z3202 Encounter for pregnancy test, result negative: Secondary | ICD-10-CM | POA: Insufficient documentation

## 2013-11-17 DIAGNOSIS — K5090 Crohn's disease, unspecified, without complications: Secondary | ICD-10-CM | POA: Insufficient documentation

## 2013-11-17 DIAGNOSIS — Z79899 Other long term (current) drug therapy: Secondary | ICD-10-CM | POA: Insufficient documentation

## 2013-11-17 DIAGNOSIS — R3915 Urgency of urination: Secondary | ICD-10-CM | POA: Insufficient documentation

## 2013-11-17 DIAGNOSIS — Z8739 Personal history of other diseases of the musculoskeletal system and connective tissue: Secondary | ICD-10-CM | POA: Insufficient documentation

## 2013-11-17 DIAGNOSIS — Z8632 Personal history of gestational diabetes: Secondary | ICD-10-CM | POA: Insufficient documentation

## 2013-11-17 DIAGNOSIS — R300 Dysuria: Secondary | ICD-10-CM | POA: Insufficient documentation

## 2013-11-17 DIAGNOSIS — N926 Irregular menstruation, unspecified: Secondary | ICD-10-CM | POA: Insufficient documentation

## 2013-11-17 DIAGNOSIS — F17200 Nicotine dependence, unspecified, uncomplicated: Secondary | ICD-10-CM | POA: Insufficient documentation

## 2013-11-17 DIAGNOSIS — Z792 Long term (current) use of antibiotics: Secondary | ICD-10-CM | POA: Insufficient documentation

## 2013-11-17 DIAGNOSIS — Z9089 Acquired absence of other organs: Secondary | ICD-10-CM | POA: Insufficient documentation

## 2013-11-17 LAB — URINALYSIS, ROUTINE W REFLEX MICROSCOPIC
BILIRUBIN URINE: NEGATIVE
Glucose, UA: NEGATIVE mg/dL
HGB URINE DIPSTICK: NEGATIVE
Ketones, ur: NEGATIVE mg/dL
Leukocytes, UA: NEGATIVE
NITRITE: NEGATIVE
Protein, ur: NEGATIVE mg/dL
SPECIFIC GRAVITY, URINE: 1.023 (ref 1.005–1.030)
Urobilinogen, UA: 1 mg/dL (ref 0.0–1.0)
pH: 5.5 (ref 5.0–8.0)

## 2013-11-17 LAB — PREGNANCY, URINE: PREG TEST UR: NEGATIVE

## 2013-11-17 MED ORDER — ONDANSETRON 4 MG PO TBDP
4.0000 mg | ORAL_TABLET | Freq: Once | ORAL | Status: AC
  Administered 2013-11-17: 4 mg via ORAL

## 2013-11-17 MED ORDER — HYDROCODONE-ACETAMINOPHEN 5-325 MG PO TABS
1.0000 | ORAL_TABLET | Freq: Once | ORAL | Status: AC
  Administered 2013-11-17: 1 via ORAL
  Filled 2013-11-17: qty 1

## 2013-11-17 MED ORDER — HYDROCODONE-ACETAMINOPHEN 5-325 MG PO TABS
ORAL_TABLET | ORAL | Status: AC
  Filled 2013-11-17: qty 2

## 2013-11-17 MED ORDER — HYDROCODONE-ACETAMINOPHEN 5-325 MG PO TABS
2.0000 | ORAL_TABLET | ORAL | Status: AC | PRN
Start: 2013-11-17 — End: ?

## 2013-11-17 MED ORDER — ONDANSETRON 4 MG PO TBDP: 4.0000 mg | ORAL_TABLET | Freq: Three times a day (TID) | ORAL | Status: DC | PRN

## 2013-11-17 MED ORDER — PREDNISONE 10 MG PO TABS: 20.0000 mg | ORAL_TABLET | Freq: Every day | ORAL | Status: AC

## 2013-11-17 MED ORDER — ONDANSETRON 4 MG PO TBDP
ORAL_TABLET | ORAL | Status: AC
  Filled 2013-11-17: qty 1

## 2013-11-17 MED ORDER — HYDROCODONE-ACETAMINOPHEN 5-325 MG PO TABS
2.0000 | ORAL_TABLET | Freq: Once | ORAL | Status: AC
  Administered 2013-11-17: 2 via ORAL

## 2013-11-17 MED ORDER — ONDANSETRON 4 MG PO TBDP
4.0000 mg | ORAL_TABLET | Freq: Once | ORAL | Status: AC
  Administered 2013-11-17: 4 mg via ORAL
  Filled 2013-11-17: qty 1

## 2013-11-17 NOTE — ED Notes (Signed)
For appx 5 weeks pt having flank and lower abd pain on right side. Painful urination.

## 2013-11-17 NOTE — ED Notes (Signed)
MD at bedside discussing test results and dispo plan of care. 

## 2013-11-17 NOTE — ED Provider Notes (Signed)
CSN: [id number]     Arrival date & time 11/17/13  1514 History  This chart was scribed for Rolland Porter, MD by Bennett Scrape, ED Scribe. This patient was seen in room MH08/MH08 and the patient's care was started at 3:55 PM.   Chief Complaint  Patient presents with  . Flank Pain    The history is provided by the patient. No language interpreter was used.    HPI Comments: Michelle Luna is a 38 y.o. female who presents to the Emergency Department complaining of RLQ abdominal pain that radiates into the right flank with associated dysuria described as burning and urinary urgency for the past 5 weeks. The pain started as a "catch" but has now become severe with movement, changing positions or bending. The symptoms started 1 to 2 weeks prior to her LNMP. She denies any prior episodes of the same. She denies any hematuria, fevers, urinary frequency, vaginal bleeding and nausea. She was diagnosed with an UTI in November 2014 but denies similarities. She has a h/o ovarian cysts "years ago" and states that her menses are always long and irregular usually lasting around 14 days. LNMP was 11 days. She denies any OB-GYN follow ups or recent PAP smears.  She denies any h/o kidney stones. She has a h/o chronic back pain from DDD.   Past Medical History  Diagnosis Date  . Degenerative disk disease   . Gestational diabetes    Past Surgical History  Procedure Laterality Date  . Cesarean section    . Cholecystectomy    . Exploratory laparotomy    . Hernia repair    . Tubal ligation     No family history on file. History  Substance Use Topics  . Smoking status: Current Every Day Smoker -- 0.75 packs/day for 20 years    Types: Cigarettes  . Smokeless tobacco: Never Used  . Alcohol Use: Yes     Comment: Rare    No OB history provided.  Review of Systems  Constitutional: Negative for fever, chills, diaphoresis, appetite change and fatigue.  HENT: Negative for mouth sores, sore throat and  trouble swallowing.   Eyes: Negative for visual disturbance.  Respiratory: Negative for cough, chest tightness, shortness of breath and wheezing.   Cardiovascular: Negative for chest pain.  Gastrointestinal: Positive for abdominal pain. Negative for nausea, vomiting, diarrhea and abdominal distention.  Endocrine: Negative for polydipsia, polyphagia and polyuria.  Genitourinary: Positive for dysuria, urgency and flank pain. Negative for frequency and hematuria.  Musculoskeletal: Negative for gait problem.  Skin: Negative for color change, pallor and rash.  Neurological: Negative for dizziness, syncope, light-headedness and headaches.  Hematological: Does not bruise/bleed easily.  Psychiatric/Behavioral: Negative for behavioral problems and confusion.    Allergies  Erythromycin and Sulfa antibiotics  Home Medications   Current Outpatient Rx  Name  Route  Sig  Dispense  Refill  . acetaminophen (TYLENOL) 500 MG tablet   Oral   Take 500 mg by mouth every 6 (six) hours as needed. Patient used this medication for her hip pain.         Marland Kitchen albuterol (PROVENTIL) (5 MG/ML) 0.5% nebulizer solution   Nebulization   Take 2.5 mg by nebulization once.           Marland Kitchen albuterol (PROVENTIL,VENTOLIN) 90 MCG/ACT inhaler   Inhalation   Inhale 2 puffs into the lungs as needed. Shortness of breath          . ciprofloxacin (CIPRO) 500 MG tablet  Oral   Take 1 tablet (500 mg total) by mouth 2 (two) times daily.   10 tablet   0   . EPINEPHrine (EPIPEN) 0.3 mg/0.3 mL DEVI   Intramuscular   Inject 0.3 mLs (0.3 mg total) into the muscle as needed.   1 mL   1   . HYDROcodone-acetaminophen (NORCO/VICODIN) 5-325 MG per tablet   Oral   Take 2 tablets by mouth every 4 (four) hours as needed for pain.   10 tablet   0   . HYDROcodone-acetaminophen (NORCO/VICODIN) 5-325 MG per tablet   Oral   Take 2 tablets by mouth every 4 (four) hours as needed.   20 tablet   0   . ibuprofen (ADVIL,MOTRIN)  200 MG tablet   Oral   Take 800 mg by mouth once. Patient used this medication for her hip pain.         Marland Kitchen. ibuprofen (ADVIL,MOTRIN) 600 MG tablet   Oral   Take 1 tablet (600 mg total) by mouth every 6 (six) hours as needed.   30 tablet   0   . metroNIDAZOLE (FLAGYL) 500 MG tablet   Oral   Take 1 tablet (500 mg total) by mouth 2 (two) times daily.   14 tablet   0   . ondansetron (ZOFRAN ODT) 4 MG disintegrating tablet   Oral   Take 1 tablet (4 mg total) by mouth every 8 (eight) hours as needed for nausea.   20 tablet   0   . oxyCODONE-acetaminophen (PERCOCET/ROXICET) 5-325 MG per tablet   Oral   Take 1 tablet by mouth every 6 (six) hours as needed for severe pain. May take 2 tablets PO q 6 hours for severe pain - Do not take with Tylenol as this tablet already contains tylenol   10 tablet   0   . predniSONE (DELTASONE) 10 MG tablet   Oral   Take 2 tablets (20 mg total) by mouth daily.   10 tablet   0    Triage Vitals: BP 126/88  Pulse 80  Temp(Src) 97.8 F (36.6 C) (Oral)  Resp 18  Ht 5\' 5"  (1.651 m)  Wt 135 lb (61.236 kg)  BMI 22.47 kg/m2  SpO2 96%  LMP 10/23/2013  Physical Exam  Nursing note and vitals reviewed. Constitutional: She is oriented to person, place, and time. She appears well-developed and well-nourished. No distress.  HENT:  Head: Normocephalic and atraumatic.  Eyes: Conjunctivae are normal. Pupils are equal, round, and reactive to light. No scleral icterus.  Neck: Normal range of motion. Neck supple. No thyromegaly present.  Cardiovascular: Normal rate and regular rhythm.  Exam reveals no gallop and no friction rub.   No murmur heard. Pulmonary/Chest: Effort normal and breath sounds normal. No respiratory distress. She has no wheezes. She has no rales.  Abdominal: Soft. Bowel sounds are normal. She exhibits no distension. There is tenderness (mild RLQ and right pelvic tenderness, mild periumbilical tenderness ). There is no rebound and no  guarding.  Musculoskeletal: Normal range of motion.  Neurological: She is alert and oriented to person, place, and time.  Skin: Skin is warm and dry. No rash noted.  Psychiatric: She has a normal mood and affect. Her behavior is normal.    ED Course  Procedures (including critical care time)  Medications  ondansetron (ZOFRAN-ODT) disintegrating tablet 4 mg (4 mg Oral Given 11/17/13 1625)  HYDROcodone-acetaminophen (NORCO/VICODIN) 5-325 MG per tablet 1 tablet (1 tablet Oral Given 11/17/13 1624)  ondansetron (ZOFRAN-ODT) disintegrating tablet 4 mg (4 mg Oral Given 11/17/13 1832)  HYDROcodone-acetaminophen (NORCO/VICODIN) 5-325 MG per tablet 2 tablet (2 tablets Oral Given 11/17/13 1831)    DIAGNOSTIC STUDIES: Oxygen Saturation is 96% on RA, normal by my interpretation.    COORDINATION OF CARE: 4:00 PM-Discussed treatment plan which includes medications, CT of abdomen and UA with pt at bedside and pt agreed to plan.   Labs Review Labs Reviewed  URINALYSIS, ROUTINE W REFLEX MICROSCOPIC - Abnormal; Notable for the following:    APPearance CLOUDY (*)    All other components within normal limits  PREGNANCY, URINE   Imaging Review Ct Abdomen Pelvis Wo Contrast  11/17/2013   CLINICAL DATA:  Right lower quadrant abdominal pain radiating to the right flank. Dysuria. Hematuria.  EXAM: CT ABDOMEN AND PELVIS WITHOUT CONTRAST  TECHNIQUE: Multidetector CT imaging of the abdomen and pelvis was performed following the standard protocol without intravenous contrast.  COMPARISON:  CT ABD/PELV WO CM dated 09/02/2013; CT ABD/PELVIS W CM dated 07/16/2011; CT ABD W/CM dated 01/09/2005  FINDINGS: Prior cholecystectomy. The noncontrast CT appearance of the liver, spleen, pancreas, and adrenal glands is within normal limits. Prominent stool throughout the colon favors constipation. Borderline dilated loops of proximal small bowel with scattered air-fluid levels.  No pathologic upper abdominal adenopathy is observed. No  pathologic pelvic adenopathy is observed. Uterine and adnexal contours unremarkable. Sclerosis along the sacroiliac joints noted bilaterally. Lower lumbar facet arthropathy noted with intervertebral spurring likely contributing to mild left foraminal stenosis at L5-S1. Diffuse disc bulge at L4-5.  A small segment of the proximal appendix is visible in does not appear inflamed.  Kidneys and proximal ureters unremarkable. Vascular calcifications noted in the pelvis.  IMPRESSION: 1. A small segment of the appendix is visualized and appears normal -the distal appendix is obscured by surrounding bowel. 2.  Prominent stool throughout the colon favors constipation. 3. Borderline dilated loops of proximal small bowel with scattered air-fluid levels. This could reflect proximal enteritis but is not dissimilar to the appearance back in November. Accordingly, noninfectious causes of proximal bowel dilatation and mild fold thickening might be considered and, including vasculitis, Whipple disease, amyloidosis, eosinophilic enteritis, and Crohn's disease. 4. Lower lumbar spondylosis and degenerative disc disease.   Electronically Signed   By: Herbie Baltimore M.D.   On: 11/17/2013 17:12    EKG Interpretation   None       MDM   1. Crohn's disease    CT findings discussed with Patient.  Possibility of Crohn.s disease discussed with patient.  Pt currently without medical insurance.  Given [facility] center referral.  Given GI referral.  Discussed Crohn's.  Pt;s father had Crohns. Will  Empirically tret with predinisone over 5 days.  Rx for Vicoden and Zofran.  I personally performed the services described in this documentation, which was scribed in my presence. The recorded information has been reviewed and is accurate.   Rolland Porter, MD 11/17/13 [phone]

## 2013-11-17 NOTE — Discharge Instructions (Signed)
Abdominal Pain, Women °Abdominal (stomach, pelvic, or belly) pain can be caused by many things. It is important to tell your doctor: °· The location of the pain. °· Does it come and go or is it present all the time? °· Are there things that start the pain (eating certain foods, exercise)? °· Are there other symptoms associated with the pain (fever, nausea, vomiting, diarrhea)? °All of this is helpful to know when trying to find the cause of the pain. °CAUSES  °· Stomach: virus or bacteria infection, or ulcer. °· Intestine: appendicitis (inflamed appendix), regional ileitis (Crohn's disease), ulcerative colitis (inflamed colon), irritable bowel syndrome, diverticulitis (inflamed diverticulum of the colon), or cancer of the stomach or intestine. °· Gallbladder disease or stones in the gallbladder. °· Kidney disease, kidney stones, or infection. °· Pancreas infection or cancer. °· Fibromyalgia (pain disorder). °· Diseases of the female organs: °· Uterus: fibroid (non-cancerous) tumors or infection. °· Fallopian tubes: infection or tubal pregnancy. °· Ovary: cysts or tumors. °· Pelvic adhesions (scar tissue). °· Endometriosis (uterus lining tissue growing in the pelvis and on the pelvic organs). °· Pelvic congestion syndrome (female organs filling up with blood just before the menstrual period). °· Pain with the menstrual period. °· Pain with ovulation (producing an egg). °· Pain with an IUD (intrauterine device, birth control) in the uterus. °· Cancer of the female organs. °· Functional pain (pain not caused by a disease, may improve without treatment). °· Psychological pain. °· Depression. °DIAGNOSIS  °Your doctor will decide the seriousness of your pain by doing an examination. °· Blood tests. °· X-rays. °· Ultrasound. °· CT scan (computed tomography, special type of X-ray). °· MRI (magnetic resonance imaging). °· Cultures, for infection. °· Barium enema (dye inserted in the large intestine, to better view it with  X-rays). °· Colonoscopy (looking in intestine with a lighted tube). °· Laparoscopy (minor surgery, looking in abdomen with a lighted tube). °· Major abdominal exploratory surgery (looking in abdomen with a large incision). °TREATMENT  °The treatment will depend on the cause of the pain.  °· Many cases can be observed and treated at home. °· Over-the-counter medicines recommended by your caregiver. °· Prescription medicine. °· Antibiotics, for infection. °· Birth control pills, for painful periods or for ovulation pain. °· Hormone treatment, for endometriosis. °· Nerve blocking injections. °· Physical therapy. °· Antidepressants. °· Counseling with a psychologist or psychiatrist. °· Minor or major surgery. °HOME CARE INSTRUCTIONS  °· Do not take laxatives, unless directed by your caregiver. °· Take over-the-counter pain medicine only if ordered by your caregiver. Do not take aspirin because it can cause an upset stomach or bleeding. °· Try a clear liquid diet (broth or water) as ordered by your caregiver. Slowly move to a bland diet, as tolerated, if the pain is related to the stomach or intestine. °· Have a thermometer and take your temperature several times a day, and record it. °· Bed rest and sleep, if it helps the pain. °· Avoid sexual intercourse, if it causes pain. °· Avoid stressful situations. °· Keep your follow-up appointments and tests, as your caregiver orders. °· If the pain does not go away with medicine or surgery, you may try: °· Acupuncture. °· Relaxation exercises (yoga, meditation). °· Group therapy. °· Counseling. °SEEK MEDICAL CARE IF:  °· You notice certain foods cause stomach pain. °· Your home care treatment is not helping your pain. °· You need stronger pain medicine. °· You want your IUD removed. °· You feel faint or   lightheaded. °· You develop nausea and vomiting. °· You develop a rash. °· You are having side effects or an allergy to your medicine. °SEEK IMMEDIATE MEDICAL CARE IF:  °· Your  pain does not go away or gets worse. °· You have a fever. °· Your pain is felt only in portions of the abdomen. The right side could possibly be appendicitis. The left lower portion of the abdomen could be colitis or diverticulitis. °· You are passing blood in your stools (bright red or black tarry stools, with or without vomiting). °· You have blood in your urine. °· You develop chills, with or without a fever. °· You pass out. °MAKE SURE YOU:  °· Understand these instructions. °· Will watch your condition. °· Will get help right away if you are not doing well or get worse. °Document Released: 07/31/2007 Document Revised: 12/26/2011 Document Reviewed: 08/20/2009 °[employer]® Patient Information ©2014 [employer]. ° °Crohn's Disease °Crohn's disease is a long-term (chronic) soreness and redness (inflammation) of the intestines (bowel). It can affect any portion of the digestive tract, from the mouth to the anus. It can also cause problems outside the digestive tract. Crohn's disease is closely related to a disease called ulcerative colitis (together, these two diseases are called inflammatory bowel disease).  °CAUSES  °The cause of Crohn's disease is not known. One theory is that, in an easily affected person, the immune system is triggered to attack the body's own digestive tissue. Crohn's disease runs in families. It seems to be more common in certain geographic areas and amongst certain races. There are no clear-cut dietary causes.  °SYMPTOMS  °Crohn's disease can cause many different symptoms since it can affect many different parts of the body. Symptoms include: °· Fatigue. °· Weight loss. °· Chronic diarrhea, sometime bloody. °· Abdominal pain and cramps. °· Fever. °· Ulcers or canker sores in the mouth or rectum. °· Anemia (low red blood cells). °· Arthritis, skin problems, and eye problems may occur. °Complications of Crohn's disease can include: °· Series of holes (perforation) of the bowel. °· Portions of  the intestines sticking to each other (adhesions). °· Obstruction of the bowel. °· Fistula formation, typically in the rectal area but also in other areas. A fistula is an opening between the bowels and the outside, or between the bowels and another organ. °· A painful crack in the mucous membrane of the anus (rectal fissure). °DIAGNOSIS  °Your caregiver may suspect Crohn's disease based on your symptoms and an exam. Blood tests may confirm that there is a problem. You may be asked to submit a stool specimen for examination. X-rays and CT scans may be necessary. Ultimately, the diagnosis is usually made after a procedure that uses a flexible tube that is inserted via your mouth or your anus. This is done under sedation and is called either an upper endoscopy or colonoscopy. With these tests, the specialist can take tiny tissue samples and remove them from the inside of the bowel (biopsy). Examination of this biopsy tissue under a microscope can reveal Crohn's disease as the cause of your symptoms. °Due to the many different forms that Crohn's disease can take, symptoms may be present for several years before a diagnosis is made. °TREATMENT  °Medications are often used to decrease inflammation and control the immune system. These include medicines related to aspirin, steroid medications, and newer and stronger medications to slow down the immune system. Some medications may be used as suppositories or enemas. A number of other medications   are used or have been studied. Your caregiver will make specific recommendations. °HOME CARE INSTRUCTIONS  °· Symptoms such as diarrhea can be controlled with medications. Avoid foods that have a laxative effect such as fresh fruit, vegetables and dairy products. During flare ups, you can rest your bowel by refraining from solid foods. Drink clear liquids frequently during the day (electrolyte or re-hydrating fluids are best. Your caregiver can help you with suggestions). Drink  often to prevent loss of body fluids (dehydration). When diarrhea has cleared, eat small meals and more frequently. Avoid food additives and stimulants such as caffeine (coffee, tea, or chocolate). Enzyme supplements may help if you develop intolerance to a sugar in dairy products (lactose). Ask your caregiver or dietitian about specific dietary instructions. °· Try to maintain a positive attitude. Learn relaxation techniques such as self hypnosis, mental imaging, and muscle relaxation. °· If possible, avoid stresses which can aggravate your condition. °· Exercise regularly. °· Follow your diet. °· Always get plenty of rest. °SEEK MEDICAL CARE IF:  °· Your symptoms fail to improve after a week or two of new treatment. °· You experience continued weight loss. °· You have ongoing cramps or loose bowels. °· You develop a new skin rash, skin sores, or eye problems. °SEEK IMMEDIATE MEDICAL CARE IF:  °· You have worsening of your symptoms or develop new symptoms. °· You have a fever. °· You develop bloody diarrhea. °· You develop severe abdominal pain. °MAKE SURE YOU:  °· Understand these instructions. °· Will watch your condition. °· Will get help right away if you are not doing well or get worse. °Document Released: 07/13/2005 Document Revised: 01/28/2013 Document Reviewed: 06/11/2007 °[employer]® Patient Information ©2014 [employer]. ° °

## 2013-12-02 ENCOUNTER — Emergency Department (INDEPENDENT_AMBULATORY_CARE_PROVIDER_SITE_OTHER)
Admission: EM | Admit: 2013-12-02 | Discharge: 2013-12-02 | Disposition: A | Discharge status: Home / Self Care | Payer: Self-pay | Source: Home / Self Care | Attending: Emergency Medicine | Admitting: Emergency Medicine

## 2013-12-02 ENCOUNTER — Encounter (HOSPITAL_COMMUNITY): Payer: Self-pay | Admitting: Emergency Medicine

## 2013-12-02 DIAGNOSIS — M545 Low back pain, unspecified: Secondary | ICD-10-CM

## 2013-12-02 DIAGNOSIS — M542 Cervicalgia: Secondary | ICD-10-CM

## 2013-12-02 MED ORDER — DICLOFENAC SODIUM 75 MG PO TBEC: 75.0000 mg | DELAYED_RELEASE_TABLET | Freq: Two times a day (BID) | ORAL | Status: AC

## 2013-12-02 MED ORDER — HYDROCODONE-ACETAMINOPHEN 5-325 MG PO TABS: ORAL_TABLET | ORAL | Status: AC

## 2013-12-02 MED ORDER — METHYLPREDNISOLONE ACETATE 80 MG/ML IJ SUSP
80.0000 mg | Freq: Once | INTRAMUSCULAR | Status: AC
  Administered 2013-12-02: 80 mg via INTRAMUSCULAR

## 2013-12-02 MED ORDER — CYCLOBENZAPRINE HCL 10 MG PO TABS: 10.0000 mg | ORAL_TABLET | Freq: Two times a day (BID) | ORAL | Status: AC | PRN

## 2013-12-02 MED ORDER — METHYLPREDNISOLONE ACETATE 80 MG/ML IJ SUSP
INTRAMUSCULAR | Status: AC
  Filled 2013-12-02: qty 1

## 2013-12-02 MED ORDER — KETOROLAC TROMETHAMINE 60 MG/2ML IM SOLN
INTRAMUSCULAR | Status: AC
  Filled 2013-12-02: qty 2

## 2013-12-02 MED ORDER — KETOROLAC TROMETHAMINE 60 MG/2ML IM SOLN
60.0000 mg | Freq: Once | INTRAMUSCULAR | Status: AC
  Administered 2013-12-02: 60 mg via INTRAMUSCULAR

## 2013-12-02 NOTE — ED Notes (Signed)
C/o neck pain.  Back pain.  And right arm pain, unable to raise arm above head.  States pain on aright side radiates down below ribs.  Hx of degenerative disk disease.  Pt also states that she was recently chopping a lot of wood which might have caused pain.

## 2013-12-02 NOTE — Discharge Instructions (Signed)
TREATMENT  °Treatment initially involves the use of ice and medication to help reduce pain and inflammation. It is also important to perform strengthening and stretching exercises and modify activities that worsen symptoms so the injury does not get worse. These exercises may be performed at home or with a therapist. For patients who experience severe symptoms, a soft padded collar may be recommended to be worn around the neck.  °Improving your posture may help reduce symptoms. Posture improvement includes pulling your chin and abdomen in while sitting or standing. If you are sitting, sit in a firm chair with your buttocks against the back of the chair. While sleeping, try replacing your pillow with a small towel rolled to 2 inches in diameter, or use a cervical pillow. Poor sleeping positions delay healing.  ° °MEDICATION  °· If pain medication is necessary, nonsteroidal anti-inflammatory medications, such as aspirin and ibuprofen, or other minor pain relievers, such as acetaminophen, are often recommended. °· Do not take pain medication for 7 days before surgery. °· Prescription pain relievers may be given if deemed necessary by your caregiver. Use only as directed and only as much as you need. ° °HEAT AND COLD:  °· Cold treatment (icing) relieves pain and reduces inflammation. Cold treatment should be applied for 10 to 15 minutes every 2 to 3 hours for inflammation and pain and immediately after any activity that aggravates your symptoms. Use ice packs or an ice massage. °· Heat treatment may be used prior to performing the stretching and strengthening activities prescribed by your caregiver, physical therapist, or athletic trainer. Use a heat pack or a warm soak. ° °SEEK MEDICAL CARE IF:  °· Symptoms get worse or do not improve in 2 weeks despite treatment. °· New, unexplained symptoms develop (drugs used in treatment may produce side effects). ° °EXERCISES °RANGE OF MOTION (ROM) AND STRETCHING EXERCISES -  Cervical Strain and Sprain °These exercises may help you when beginning to rehabilitate your injury. In order to successfully resolve your symptoms, you must improve your posture. These exercises are designed to help reduce the forward-head and rounded-shoulder posture which contributes to this condition. Your symptoms may resolve with or without further involvement from your physician, physical therapist or athletic trainer. While completing these exercises, remember:  °· Restoring tissue flexibility helps normal motion to return to the joints. This allows healthier, less painful movement and activity. °· An effective stretch should be held for at least 20 seconds, although you may need to begin with shorter hold times for comfort. °· A stretch should never be painful. You should only feel a gentle lengthening or release in the stretched tissue. ° °STRETCH- Axial Extensors °· Lie on your back on the floor. You may bend your knees for comfort. Place a rolled up hand towel or dish towel, about 2 inches in diameter, under the part of your head that makes contact with the floor. °· Gently tuck your chin, as if trying to make a "double chin," until you feel a gentle stretch at the base of your head. °· Hold _____10_____ seconds. °Repeat _____10_____ times. Complete this exercise _____2_____ times per day.  ° °STRETECH - Axial Extension  °· Stand or sit on a firm surface. Assume a good posture: chest up, shoulders drawn back, abdominal muscles slightly tense, knees unlocked (if standing) and feet hip width apart. °· Slowly retract your chin so your head slides back and your chin slightly lowers.Continue to look straight ahead. °· You should feel a gentle stretch   in the back of your head. Be certain not to feel an aggressive stretch since this can cause headaches later. °· Hold for ____10______ seconds. °Repeat _____10_____ times. Complete this exercise ____2______ times per day. ° °STRETCH  Cervical Side Bend  °· Stand  or sit on a firm surface. Assume a good posture: chest up, shoulders drawn back, abdominal muscles slightly tense, knees unlocked (if standing) and feet hip width apart. °· Without letting your nose or shoulders move, slowly tip your right / left ear to your shoulder until your feel a gentle stretch in the muscles on the opposite side of your neck. °· Hold _____10_____ seconds. °Repeat _____10_____ times. Complete this exercise _____2_____ times per day. ° °STRETCH  Cervical Rotators  °· Stand or sit on a firm surface. Assume a good posture: chest up, shoulders drawn back, abdominal muscles slightly tense, knees unlocked (if standing) and feet hip width apart. °· Keeping your eyes level with the ground, slowly turn your head until you feel a gentle stretch along the back and opposite side of your neck. °· Hold _____10_____ seconds. °Repeat ____10______ times. Complete this exercise ____2______ times per day. ° °RANGE OF MOTION - Neck Circles  °· Stand or sit on a firm surface. Assume a good posture: chest up, shoulders drawn back, abdominal muscles slightly tense, knees unlocked (if standing) and feet hip width apart. °· Gently roll your head down and around from the back of one shoulder to the back of the other. The motion should never be forced or painful. °· Repeat the motion 10-20 times, or until you feel the neck muscles relax and loosen. °Repeat ____10______ times. Complete the exercise _____2_____ times per day. ° °STRENGTHENING EXERCISES - Cervical Strain and Sprain °These exercises may help you when beginning to rehabilitate your injury. They may resolve your symptoms with or without further involvement from your physician, physical therapist or athletic trainer. While completing these exercises, remember:  °· Muscles can gain both the endurance and the strength needed for everyday activities through controlled exercises. °· Complete these exercises as instructed by your physician, physical therapist or  athletic trainer. Progress the resistance and repetitions only as guided. °· You may experience muscle soreness or fatigue, but the pain or discomfort you are trying to eliminate should never worsen during these exercises. If this pain does worsen, stop and make certain you are following the directions exactly. If the pain is still present after adjustments, discontinue the exercise until you can discuss the trouble with your clinician. ° °STRENGTH Cervical Flexors, Isometric °· Face a wall, standing about 6 inches away. Place a small pillow, a ball about 6-8 inches in diameter, or a folded towel between your forehead and the wall. °· Slightly tuck your chin and gently push your forehead into the soft object. Push only with mild to moderate intensity, building up tension gradually. Keep your jaw and forehead relaxed. °· Hold 10 to 20 seconds. Keep your breathing relaxed. °· Release the tension slowly. Relax your neck muscles completely before you start the next repetition. °Repeat _____10_____ times. Complete this exercise _____2_____ times per day. ° °STRENGTH- Cervical Lateral Flexors, Isometric  °· Stand about 6 inches away from a wall. Place a small pillow, a ball about 6-8 inches in diameter, or a folded towel between the side of your head and the wall. °· Slightly tuck your chin and gently tilt your head into the soft object. Push only with mild to moderate intensity, building up tension gradually. Keep   your jaw and forehead relaxed. °· Hold 10 to 20 seconds. Keep your breathing relaxed. °· Release the tension slowly. Relax your neck muscles completely before you start the next repetition. °Repeat _____10_____ times. Complete this exercise ____2______ times per day. ° °STRENGTH  Cervical Extensors, Isometric  °· Stand about 6 inches away from a wall. Place a small pillow, a ball about 6-8 inches in diameter, or a folded towel between the back of your head and the wall. °· Slightly tuck your chin and gently  tilt your head back into the soft object. Push only with mild to moderate intensity, building up tension gradually. Keep your jaw and forehead relaxed. °· Hold 10 to 20 seconds. Keep your breathing relaxed. °· Release the tension slowly. Relax your neck muscles completely before you start the next repetition. °Repeat _____10_____ times. Complete this exercise _____2_____ times per day. ° °POSTURE AND BODY MECHANICS CONSIDERATIONS - Cervical Strain and Sprain °Keeping correct posture when sitting, standing or completing your activities will reduce the stress put on different body tissues, allowing injured tissues a chance to heal and limiting painful experiences. The following are general guidelines for improved posture. Your physician or physical therapist will provide you with any instructions specific to your needs. While reading these guidelines, remember: °· The exercises prescribed by your provider will help you have the flexibility and strength to maintain correct postures. °· The correct posture provides the optimal environment for your joints to work. All of your joints have less wear and tear when properly supported by a spine with good posture. This means you will experience a healthier, less painful body. °· Correct posture must be practiced with all of your activities, especially prolonged sitting and standing. Correct posture is as important when doing repetitive low-stress activities (typing) as it is when doing a single heavy-load activity (lifting). °PROLONGED STANDING WHILE SLIGHTLY LEANING FORWARD °When completing a task that requires you to lean forward while standing in one place for a long time, place either foot up on a stationary 2-4 inch high object to help maintain the best posture. When both feet are on the ground, the low back tends to lose its slight inward curve. If this curve flattens (or becomes too large), then the back and your other joints will experience too much stress, fatigue  more quickly and can cause pain.  °RESTING POSITIONS °Consider which positions are most painful for you when choosing a resting position. If you have pain with flexion-based activities (sitting, bending, stooping, squatting), choose a position that allows you to rest in a less flexed posture. You would want to avoid curling into a fetal position on your side. If your pain worsens with extension-based activities (prolonged standing, working overhead), avoid resting in an extended position such as sleeping on your stomach. Most people will find more comfort when they rest with their spine in a more neutral position, neither too rounded nor too arched. Lying on a non-sagging bed on your side with a pillow between your knees, or on your back with a pillow under your knees will often provide some relief. Keep in mind, being in any one position for a prolonged period of time, no matter how correct your posture, can still lead to stiffness. °WALKING °Walk with an upright posture. Your ears, shoulders and hips should all line-up. °OFFICE WORK °When working at a desk, create an environment that supports good, upright posture. Without extra support, muscles fatigue and lead to excessive strain on joints and other tissues. °  CHAIR: °· A chair should be able to slide under your desk when your back makes contact with the back of the chair. This allows you to work closely. °· The chair's height should allow your eyes to be level with the upper part of your monitor and your hands to be slightly lower than your elbows. °· Body position: °· Your feet should make contact with the floor. If this is not possible, use a foot rest. °· Keep your ears over your shoulders. This will reduce stress on your neck and low back. °Document Released: 10/03/2005 Document Revised: 12/26/2011 Document Reviewed: 01/15/2009 °Exit[employer]® Patient Information ©2013 [employer]. ° °Do exercises twice daily followed by moist heat for 15  minutes. ° ° ° ° ° °Try to be as active as possible. ° °If no better in 2 weeks, follow up with orthopedist. ° ° °

## 2013-12-02 NOTE — ED Provider Notes (Signed)
Chief Complaint   Chief Complaint  Patient presents with  . Neck Pain  . Back Pain    History of Present Illness   Michelle Luna is a 38 year old female who's had a two-day history of pain in her neck. This is worse with movement. It's localized around the right trapezius ridge and radiates to the right shoulder. It also radiates to the lower back. It's been associated with muscle spasm. There is been no radiation down the arm or the leg and no numbness, tingling, weakness in arms or legs. No bladder or bowel dysfunction. She denies any fever, chills, or unintended weight loss. The patient carries some heavy wood right before this came on. She denies any other injury. She has a history of degenerative disc disease, but has not seen a doctor for this in years.  Review of Systems   Other than noted above, the patient denies any of the following symptoms: Constitutional:  No fever, chills, or sweats. ENT:  No nasal congestion, sore throat, or oral ulcerations or lesions. Neck:  No swelling, or adenopathy.  Full ROM without pain. Cardiac:  No chest pain, tightness, or pressure. Respiratory:  No cough, wheezing, or dyspnea. M-S:  No joint pain, muscle pain, or back problems. Neuro:  No muscle weakness, numbness or paresthesias.  PMFSH   Past medical history, family history, social history, meds, and allergies were reviewed.  She's allergic to erythromycin sulfa. She has asthma.  Physical Examination    Vital signs:  BP 135/94  Pulse 90  Temp(Src) 98.3 F (36.8 C) (Oral)  Resp 16  SpO2 100%  LMP 11/18/2013 General:  Alert, oriented and in no distress. Eye:  PERRL, full EOMs. ENT:  Pharynx clear, no oral lesions. Neck:  There is tenderness to palpation in the right trapezius ridge. The neck has a limited range of motion with pain. Lungs:  No respiratory distress.  Breath sounds clear and equal bilaterally.  No wheezes, rales or rhonchi. Heart:  Regular rhythm.  No gallops,  murmers, or rubs. Back: Pain to palpation in the lumbar spine on the right. Limited range of motion with pain. Straight leg raising is negative. Ext:  No upper extremity edema, pulses full.  Full ROM of joints with no joint or muscle pain to palpation. Her right shoulder also has a limited range of motion with pain on abduction. Neuro:  Alert and oriented times 3.  No focal muscle weakness.  DTRs symmetric.  Sensation intact to light touch. Skin: Clear, warm and dry.  No rash.  Good capillary refill.  [facility]   Given Toradol 60 mg IM and Depo-Medrol 80 mg IM.  Assessment   The primary encounter diagnosis was Neck pain. A diagnosis of Lumbago was also pertinent to this visit.  Plan    1.  Meds:  The following meds were prescribed:   Discharge Medication List as of 12/02/2013  1:03 PM    START taking these medications   Details  cyclobenzaprine (FLEXERIL) 10 MG tablet Take 1 tablet (10 mg total) by mouth 2 (two) times daily as needed for muscle spasms., Starting 12/02/2013, Until Discontinued, Normal    diclofenac (VOLTAREN) 75 MG EC tablet Take 1 tablet (75 mg total) by mouth 2 (two) times daily., Starting 12/02/2013, Until Discontinued, Normal    !! HYDROcodone-acetaminophen (NORCO/VICODIN) 5-325 MG per tablet 1 to 2 tabs every 4 to 6 hours as needed for pain., Print     !! - Potential duplicate  medications found. Please discuss with provider.      2.  Patient Education/Counseling:  The patient was given appropriate handouts, self care instructions, and instructed in symptomatic relief.  Given back and neck exercises start on.  3.  Follow up:  The patient was told to follow up here if no better in 3 to 4 days, or sooner if becoming worse in any way, and given some red flag symptoms such as worsening pain or new neurological symptoms which would prompt immediate return.  Followup with Dr. Lovell Sheehan for neurosurgical evaluation.     Reuben Likes, MD 12/02/13  [phone]

## 2014-03-10 ENCOUNTER — Encounter (HOSPITAL_BASED_OUTPATIENT_CLINIC_OR_DEPARTMENT_OTHER): Payer: Self-pay | Admitting: Emergency Medicine

## 2014-03-10 ENCOUNTER — Emergency Department (HOSPITAL_BASED_OUTPATIENT_CLINIC_OR_DEPARTMENT_OTHER)
Admission: EM | Admit: 2014-03-10 | Discharge: 2014-03-10 | Disposition: A | Discharge status: Home / Self Care | Payer: Self-pay | Attending: Emergency Medicine | Admitting: Emergency Medicine

## 2014-03-10 DIAGNOSIS — Z3202 Encounter for pregnancy test, result negative: Secondary | ICD-10-CM | POA: Insufficient documentation

## 2014-03-10 DIAGNOSIS — Z8632 Personal history of gestational diabetes: Secondary | ICD-10-CM | POA: Insufficient documentation

## 2014-03-10 DIAGNOSIS — N12 Tubulo-interstitial nephritis, not specified as acute or chronic: Secondary | ICD-10-CM | POA: Insufficient documentation

## 2014-03-10 DIAGNOSIS — Z791 Long term (current) use of non-steroidal anti-inflammatories (NSAID): Secondary | ICD-10-CM | POA: Insufficient documentation

## 2014-03-10 DIAGNOSIS — F17200 Nicotine dependence, unspecified, uncomplicated: Secondary | ICD-10-CM | POA: Insufficient documentation

## 2014-03-10 DIAGNOSIS — IMO0002 Reserved for concepts with insufficient information to code with codable children: Secondary | ICD-10-CM | POA: Insufficient documentation

## 2014-03-10 DIAGNOSIS — Z792 Long term (current) use of antibiotics: Secondary | ICD-10-CM | POA: Insufficient documentation

## 2014-03-10 DIAGNOSIS — Z79899 Other long term (current) drug therapy: Secondary | ICD-10-CM | POA: Insufficient documentation

## 2014-03-10 LAB — URINALYSIS, ROUTINE W REFLEX MICROSCOPIC
Glucose, UA: NEGATIVE mg/dL
HGB URINE DIPSTICK: NEGATIVE
Ketones, ur: 15 mg/dL — AB
Nitrite: NEGATIVE
Protein, ur: NEGATIVE mg/dL
SPECIFIC GRAVITY, URINE: 1.029 (ref 1.005–1.030)
UROBILINOGEN UA: 1 mg/dL (ref 0.0–1.0)
pH: 5 (ref 5.0–8.0)

## 2014-03-10 LAB — URINE MICROSCOPIC-ADD ON

## 2014-03-10 LAB — PREGNANCY, URINE: PREG TEST UR: NEGATIVE

## 2014-03-10 MED ORDER — LIDOCAINE HCL (PF) 1 % IJ SOLN
INTRAMUSCULAR | Status: AC
  Administered 2014-03-10: 5 mL
  Filled 2014-03-10: qty 5

## 2014-03-10 MED ORDER — PHENAZOPYRIDINE HCL 100 MG PO TABS
100.0000 mg | ORAL_TABLET | Freq: Once | ORAL | Status: AC
  Administered 2014-03-10: 100 mg via ORAL
  Filled 2014-03-10: qty 1

## 2014-03-10 MED ORDER — CEFTRIAXONE SODIUM 1 G IJ SOLR
1.0000 g | Freq: Once | INTRAMUSCULAR | Status: AC
  Administered 2014-03-10: 1 g via INTRAMUSCULAR
  Filled 2014-03-10: qty 10

## 2014-03-10 MED ORDER — ONDANSETRON 4 MG PO TBDP: 4.0000 mg | ORAL_TABLET | Freq: Three times a day (TID) | ORAL | Status: DC | PRN

## 2014-03-10 MED ORDER — ONDANSETRON 4 MG PO TBDP
4.0000 mg | ORAL_TABLET | Freq: Once | ORAL | Status: AC
  Administered 2014-03-10: 4 mg via ORAL
  Filled 2014-03-10: qty 1

## 2014-03-10 MED ORDER — CIPROFLOXACIN HCL 500 MG PO TABS: 500.0000 mg | ORAL_TABLET | Freq: Two times a day (BID) | ORAL | Status: AC

## 2014-03-10 MED ORDER — HYDROCODONE-ACETAMINOPHEN 5-325 MG PO TABS: 2.0000 | ORAL_TABLET | ORAL | Status: AC | PRN

## 2014-03-10 NOTE — Discharge Instructions (Signed)
Pyelonephritis, Adult °Pyelonephritis is a kidney infection. A kidney infection can happen quickly, or it can last for a long time. °HOME CARE  °· Take your medicine (antibiotics) as told. Finish it even if you start to feel better. °· Keep all doctor visits as told. °· Drink enough fluids to keep your pee (urine) clear or pale yellow. °· Only take medicine as told by your doctor. °GET HELP RIGHT AWAY IF:  °· You have a fever or lasting symptoms for more than 2-3 days. °· You have a fever and your symptoms suddenly get worse. °· You cannot take your medicine or drink fluids as told. °· You have chills and shaking. °· You feel very weak or pass out (faint). °· You do not feel better after 2 days. °MAKE SURE YOU: °· Understand these instructions. °· Will watch your condition. °· Will get help right away if you are not doing well or get worse. °Document Released: 11/10/2004 Document Revised: 04/03/2012 Document Reviewed: 03/23/2011 °Exit[employer]® Patient Information ©2014 [employer]. ° °

## 2014-03-10 NOTE — ED Provider Notes (Signed)
CSN: [id number]     Arrival date & time 03/10/14  1349 History   First MD Initiated Contact with Patient 03/10/14 1448     Chief Complaint  Patient presents with  . Dysuria      HPI  Patient presents with right flank pain and dysuria. Symptoms are to 3 days. History of UTIs feel similar no hematuria. No fever shakes chills and one episode of nausea this morning flank pain history pubic pain she presents here. Last UTI was in February. Said she improved and then her symptoms recurred. Underwent antibiotics x2 but resolved. No symptoms since.  Past Medical History  Diagnosis Date  . Degenerative disk disease   . Gestational diabetes    Past Surgical History  Procedure Laterality Date  . Cesarean section    . Cholecystectomy    . Exploratory laparotomy    . Hernia repair    . Tubal ligation     No family history on file. History  Substance Use Topics  . Smoking status: Current Every Day Smoker -- 0.75 packs/day for 20 years    Types: Cigarettes  . Smokeless tobacco: Never Used  . Alcohol Use: Yes     Comment: Rare   OB History   Grav Para Term Preterm Abortions TAB SAB Ect Mult Living                 Review of Systems  Constitutional: Negative for fever, chills, diaphoresis, appetite change and fatigue.  HENT: Negative for mouth sores, sore throat and trouble swallowing.   Eyes: Negative for visual disturbance.  Respiratory: Negative for cough, chest tightness, shortness of breath and wheezing.   Cardiovascular: Negative for chest pain.  Gastrointestinal: Negative for nausea, vomiting, abdominal pain, diarrhea and abdominal distention.  Endocrine: Negative for polydipsia, polyphagia and polyuria.  Genitourinary: Positive for dysuria, frequency and flank pain. Negative for hematuria.  Musculoskeletal: Negative for gait problem.  Skin: Negative for color change, pallor and rash.  Neurological: Negative for dizziness, syncope, light-headedness and headaches.   Hematological: Does not bruise/bleed easily.  Psychiatric/Behavioral: Negative for behavioral problems and confusion.      Allergies  Erythromycin and Sulfa antibiotics  Home Medications   Prior to Admission medications   Medication Sig Start Date End Date Taking? Authorizing Provider  acetaminophen (TYLENOL) 500 MG tablet Take 500 mg by mouth every 6 (six) hours as needed. Patient used this medication for her hip pain.    Historical Provider, MD  albuterol (PROVENTIL) (5 MG/ML) 0.5% nebulizer solution Take 2.5 mg by nebulization once.      Historical Provider, MD  albuterol (PROVENTIL,VENTOLIN) 90 MCG/ACT inhaler Inhale 2 puffs into the lungs as needed. Shortness of breath     Historical Provider, MD  ciprofloxacin (CIPRO) 500 MG tablet Take 1 tablet (500 mg total) by mouth 2 (two) times daily. 09/02/13   Junius FinnerErin O'Malley, PA-C  cyclobenzaprine (FLEXERIL) 10 MG tablet Take 1 tablet (10 mg total) by mouth 2 (two) times daily as needed for muscle spasms. 12/02/13   Reuben Likesavid C Keller, MD  diclofenac (VOLTAREN) 75 MG EC tablet Take 1 tablet (75 mg total) by mouth 2 (two) times daily. 12/02/13   Reuben Likesavid C Keller, MD  EPINEPHrine (EPIPEN) 0.3 mg/0.3 mL DEVI Inject 0.3 mLs (0.3 mg total) into the muscle as needed. 05/11/11   Geoffery Lyonsouglas Delo, MD  HYDROcodone-acetaminophen (NORCO/VICODIN) 5-325 MG per tablet Take 2 tablets by mouth every 4 (four) hours as needed for pain. 11/09/12   Teressa LowerVrinda Pickering, NP  HYDROcodone-acetaminophen (NORCO/VICODIN) 5-325 MG per tablet Take 2 tablets by mouth every 4 (four) hours as needed. 11/17/13   Rolland Porter, MD  HYDROcodone-acetaminophen (NORCO/VICODIN) 5-325 MG per tablet 1 to 2 tabs every 4 to 6 hours as needed for pain. 12/02/13   Reuben Likes, MD  ibuprofen (ADVIL,MOTRIN) 200 MG tablet Take 800 mg by mouth once. Patient used this medication for her hip pain.    Historical Provider, MD  ibuprofen (ADVIL,MOTRIN) 600 MG tablet Take 1 tablet (600 mg total) by mouth every 6 (six)  hours as needed. 09/02/13   Junius Finner, PA-C  metroNIDAZOLE (FLAGYL) 500 MG tablet Take 1 tablet (500 mg total) by mouth 2 (two) times daily. 09/02/13   Junius Finner, PA-C  ondansetron (ZOFRAN ODT) 4 MG disintegrating tablet Take 1 tablet (4 mg total) by mouth every 8 (eight) hours as needed for nausea. 11/17/13   Rolland Porter, MD  oxyCODONE-acetaminophen (PERCOCET/ROXICET) 5-325 MG per tablet Take 1 tablet by mouth every 6 (six) hours as needed for severe pain. May take 2 tablets PO q 6 hours for severe pain - Do not take with Tylenol as this tablet already contains tylenol 09/02/13   Junius Finner, PA-C  predniSONE (DELTASONE) 10 MG tablet Take 2 tablets (20 mg total) by mouth daily. 11/17/13   Rolland Porter, MD   BP 134/94  Pulse 81  Temp(Src) 98.3 F (36.8 C) (Oral)  Resp 18  Ht 5\' 5"  (1.651 m)  Wt 135 lb (61.236 kg)  BMI 22.47 kg/m2  SpO2 100%  LMP 02/28/2014 Physical Exam  Constitutional: She is oriented to person, place, and time. She appears well-developed and well-nourished. No distress.  HENT:  Head: Normocephalic.  Eyes: Conjunctivae are normal. Pupils are equal, round, and reactive to light. No scleral icterus.  Neck: Normal range of motion. Neck supple. No thyromegaly present.  Cardiovascular: Normal rate and regular rhythm.  Exam reveals no gallop and no friction rub.   No murmur heard. Pulmonary/Chest: Effort normal and breath sounds normal. No respiratory distress. She has no wheezes. She has no rales.  Abdominal: Soft. Bowel sounds are normal. She exhibits no distension. There is no tenderness. There is no rebound.  To percuss in the right flank. Complains of suprapubic pain. No reproducible tenderness. No peritoneal irritation.  Musculoskeletal: Normal range of motion.  Neurological: She is alert and oriented to person, place, and time.  Skin: Skin is warm and dry. No rash noted.  Psychiatric: She has a normal mood and affect. Her behavior is normal.    ED Course   Procedures (including critical care time) Labs Review Labs Reviewed  URINALYSIS, ROUTINE W REFLEX MICROSCOPIC - Abnormal; Notable for the following:    Color, Urine AMBER (*)    APPearance CLOUDY (*)    Bilirubin Urine SMALL (*)    Ketones, ur 15 (*)    Leukocytes, UA MODERATE (*)    All other components within normal limits  URINE MICROSCOPIC-ADD ON - Abnormal; Notable for the following:    Squamous Epithelial / LPF FEW (*)    All other components within normal limits  PREGNANCY, URINE    Imaging Review No results found.   EKG Interpretation None      MDM   Final diagnoses:  Pyelonephritis    Tear bursitis infected. She had some nausea this morning. Tenderness in the right flank suggest pyelonephritis. Plan will be IV Rocephin. Zofran by mouth. Also Pyridium. Plan for Zofran, Cipro, Vicodin at home. Urine culture as  she did have complication with her UTI in February and had to go back to her physician for repeat course of antibiotics. She isappropriate for outpatient treatment.    Rolland Porter, MD 03/10/14 [phone]

## 2014-03-10 NOTE — ED Notes (Signed)
Dysuria x 3 days. Frequency, scanty amounts.

## 2014-03-12 ENCOUNTER — Encounter (HOSPITAL_BASED_OUTPATIENT_CLINIC_OR_DEPARTMENT_OTHER): Payer: Self-pay | Admitting: Emergency Medicine

## 2014-03-12 ENCOUNTER — Emergency Department (HOSPITAL_BASED_OUTPATIENT_CLINIC_OR_DEPARTMENT_OTHER): Payer: Self-pay

## 2014-03-12 ENCOUNTER — Emergency Department (HOSPITAL_BASED_OUTPATIENT_CLINIC_OR_DEPARTMENT_OTHER)
Admission: EM | Admit: 2014-03-12 | Discharge: 2014-03-12 | Disposition: A | Discharge status: Home / Self Care | Payer: Self-pay | Attending: Emergency Medicine | Admitting: Emergency Medicine

## 2014-03-12 DIAGNOSIS — R300 Dysuria: Secondary | ICD-10-CM | POA: Insufficient documentation

## 2014-03-12 DIAGNOSIS — Z79899 Other long term (current) drug therapy: Secondary | ICD-10-CM | POA: Insufficient documentation

## 2014-03-12 DIAGNOSIS — IMO0002 Reserved for concepts with insufficient information to code with codable children: Secondary | ICD-10-CM | POA: Insufficient documentation

## 2014-03-12 DIAGNOSIS — R109 Unspecified abdominal pain: Secondary | ICD-10-CM | POA: Insufficient documentation

## 2014-03-12 DIAGNOSIS — Z791 Long term (current) use of non-steroidal anti-inflammatories (NSAID): Secondary | ICD-10-CM | POA: Insufficient documentation

## 2014-03-12 DIAGNOSIS — N12 Tubulo-interstitial nephritis, not specified as acute or chronic: Secondary | ICD-10-CM | POA: Insufficient documentation

## 2014-03-12 DIAGNOSIS — Z8719 Personal history of other diseases of the digestive system: Secondary | ICD-10-CM | POA: Insufficient documentation

## 2014-03-12 DIAGNOSIS — Z792 Long term (current) use of antibiotics: Secondary | ICD-10-CM | POA: Insufficient documentation

## 2014-03-12 DIAGNOSIS — F17200 Nicotine dependence, unspecified, uncomplicated: Secondary | ICD-10-CM | POA: Insufficient documentation

## 2014-03-12 DIAGNOSIS — Z87440 Personal history of urinary (tract) infections: Secondary | ICD-10-CM | POA: Insufficient documentation

## 2014-03-12 DIAGNOSIS — Z8632 Personal history of gestational diabetes: Secondary | ICD-10-CM | POA: Insufficient documentation

## 2014-03-12 HISTORY — DX: Urinary tract infection, site not specified: N39.0

## 2014-03-12 LAB — URINALYSIS, ROUTINE W REFLEX MICROSCOPIC
BILIRUBIN URINE: NEGATIVE
GLUCOSE, UA: NEGATIVE mg/dL
HGB URINE DIPSTICK: NEGATIVE
Ketones, ur: NEGATIVE mg/dL
Leukocytes, UA: NEGATIVE
Nitrite: NEGATIVE
PH: 6.5 (ref 5.0–8.0)
Protein, ur: NEGATIVE mg/dL
SPECIFIC GRAVITY, URINE: 1.019 (ref 1.005–1.030)
UROBILINOGEN UA: 0.2 mg/dL (ref 0.0–1.0)

## 2014-03-12 LAB — CBC WITH DIFFERENTIAL/PLATELET
BASOS ABS: 0 10*3/uL (ref 0.0–0.1)
BASOS PCT: 0 % (ref 0–1)
Eosinophils Absolute: 0.2 10*3/uL (ref 0.0–0.7)
Eosinophils Relative: 3 % (ref 0–5)
HEMATOCRIT: 37.7 % (ref 36.0–46.0)
HEMOGLOBIN: 13 g/dL (ref 12.0–15.0)
LYMPHS PCT: 43 % (ref 12–46)
Lymphs Abs: 3.5 10*3/uL (ref 0.7–4.0)
MCH: 34.2 pg — ABNORMAL HIGH (ref 26.0–34.0)
MCHC: 34.5 g/dL (ref 30.0–36.0)
MCV: 99.2 fL (ref 78.0–100.0)
MONO ABS: 0.8 10*3/uL (ref 0.1–1.0)
MONOS PCT: 10 % (ref 3–12)
Neutro Abs: 3.7 10*3/uL (ref 1.7–7.7)
Neutrophils Relative %: 45 % (ref 43–77)
Platelets: 175 10*3/uL (ref 150–400)
RBC: 3.8 MIL/uL — ABNORMAL LOW (ref 3.87–5.11)
RDW: 12.2 % (ref 11.5–15.5)
WBC: 8.3 10*3/uL (ref 4.0–10.5)

## 2014-03-12 LAB — COMPREHENSIVE METABOLIC PANEL
ALBUMIN: 3.6 g/dL (ref 3.5–5.2)
ALK PHOS: 61 U/L (ref 39–117)
ALT: 26 U/L (ref 0–35)
AST: 23 U/L (ref 0–37)
BUN: 13 mg/dL (ref 6–23)
CO2: 24 meq/L (ref 19–32)
CREATININE: 0.7 mg/dL (ref 0.50–1.10)
Calcium: 9.3 mg/dL (ref 8.4–10.5)
Chloride: 105 mEq/L (ref 96–112)
GFR calc Af Amer: >90 mL/min (ref >90)
Glucose, Bld: 94 mg/dL (ref 70–99)
POTASSIUM: 4.4 meq/L (ref 3.7–5.3)
Sodium: 141 mEq/L (ref 137–147)
Total Bilirubin: <0.2 mg/dL — ABNORMAL LOW (ref 0.3–1.2)
Total Protein: 6.8 g/dL (ref 6.0–8.3)

## 2014-03-12 MED ORDER — OXYCODONE-ACETAMINOPHEN 5-325 MG PO TABS: 1.0000 | ORAL_TABLET | Freq: Four times a day (QID) | ORAL | Status: AC | PRN

## 2014-03-12 MED ORDER — HYDROMORPHONE HCL PF 2 MG/ML IJ SOLN
2.0000 mg | Freq: Once | INTRAMUSCULAR | Status: AC
  Administered 2014-03-12: 2 mg via INTRAMUSCULAR
  Filled 2014-03-12: qty 1

## 2014-03-12 MED ORDER — ONDANSETRON 4 MG PO TBDP
4.0000 mg | ORAL_TABLET | Freq: Once | ORAL | Status: AC
  Administered 2014-03-12: 4 mg via ORAL
  Filled 2014-03-12: qty 1

## 2014-03-12 NOTE — ED Notes (Signed)
pts ride has arrived pain meds given

## 2014-03-12 NOTE — ED Notes (Signed)
Diagnosed with kidney infection here 2 days ago.  Sts she is taking abx.  Pain is no better.  Pain mostly right flank radiating to rlq. [facility].  They recommended she come back here.

## 2014-03-12 NOTE — ED Provider Notes (Signed)
CSN: [id number]     Arrival date & time 03/12/14  1546 History   First MD Initiated Contact with Patient 03/12/14 1711     Chief Complaint  Patient presents with  . Flank Pain     (Consider location/radiation/quality/duration/timing/severity/associated sxs/prior Treatment) Patient is a 38 y.o. female presenting with flank pain. The history is provided by the patient.  Flank Pain This is a new problem. Episode onset: 4 days. The problem occurs constantly. The problem has been gradually worsening. Associated symptoms comments: Was seen here 2 days ago and dx with pyelo but states since then continues to have dysuria/worsening right flank pain but nausea has improved.  Denies fever or diarrhea. Exacerbated by: lying down and urinating. Nothing relieves the symptoms. Treatments tried: vicodin and tylenol. The treatment provided no relief.    Past Medical History  Diagnosis Date  . Degenerative disk disease   . Gestational diabetes   . UTI (lower urinary tract infection)    Past Surgical History  Procedure Laterality Date  . Cesarean section    . Cholecystectomy    . Exploratory laparotomy    . Hernia repair    . Tubal ligation     No family history on file. History  Substance Use Topics  . Smoking status: Current Every Day Smoker -- 0.75 packs/day for 20 years    Types: Cigarettes  . Smokeless tobacco: Never Used  . Alcohol Use: Yes     Comment: Rare   OB History   Grav Para Term Preterm Abortions TAB SAB Ect Mult Living                 Review of Systems  Genitourinary: Positive for flank pain.  All other systems reviewed and are negative.     Allergies  Erythromycin and Sulfa antibiotics  Home Medications   Prior to Admission medications   Medication Sig Start Date End Date Taking? Authorizing Provider  acetaminophen (TYLENOL) 500 MG tablet Take 500 mg by mouth every 6 (six) hours as needed. Patient used this medication for her hip pain.    Historical Provider,  MD  albuterol (PROVENTIL) (5 MG/ML) 0.5% nebulizer solution Take 2.5 mg by nebulization once.      Historical Provider, MD  albuterol (PROVENTIL,VENTOLIN) 90 MCG/ACT inhaler Inhale 2 puffs into the lungs as needed. Shortness of breath     Historical Provider, MD  ciprofloxacin (CIPRO) 500 MG tablet Take 1 tablet (500 mg total) by mouth 2 (two) times daily. 09/02/13   Junius FinnerErin O'Malley, PA-C  ciprofloxacin (CIPRO) 500 MG tablet Take 1 tablet (500 mg total) by mouth every 12 (twelve) hours. 03/10/14   Rolland PorterMark James, MD  cyclobenzaprine (FLEXERIL) 10 MG tablet Take 1 tablet (10 mg total) by mouth 2 (two) times daily as needed for muscle spasms. 12/02/13   Reuben Likesavid C Keller, MD  diclofenac (VOLTAREN) 75 MG EC tablet Take 1 tablet (75 mg total) by mouth 2 (two) times daily. 12/02/13   Reuben Likesavid C Keller, MD  EPINEPHrine (EPIPEN) 0.3 mg/0.3 mL DEVI Inject 0.3 mLs (0.3 mg total) into the muscle as needed. 05/11/11   Geoffery Lyonsouglas Delo, MD  HYDROcodone-acetaminophen (NORCO/VICODIN) 5-325 MG per tablet Take 2 tablets by mouth every 4 (four) hours as needed for pain. 11/09/12   Teressa LowerVrinda Pickering, NP  HYDROcodone-acetaminophen (NORCO/VICODIN) 5-325 MG per tablet Take 2 tablets by mouth every 4 (four) hours as needed. 11/17/13   Rolland PorterMark James, MD  HYDROcodone-acetaminophen (NORCO/VICODIN) 5-325 MG per tablet 1 to 2 tabs every 4  to 6 hours as needed for pain. 12/02/13   Reuben Likes, MD  HYDROcodone-acetaminophen (NORCO/VICODIN) 5-325 MG per tablet Take 2 tablets by mouth every 4 (four) hours as needed. 03/10/14   Rolland Porter, MD  ibuprofen (ADVIL,MOTRIN) 200 MG tablet Take 800 mg by mouth once. Patient used this medication for her hip pain.    Historical Provider, MD  ibuprofen (ADVIL,MOTRIN) 600 MG tablet Take 1 tablet (600 mg total) by mouth every 6 (six) hours as needed. 09/02/13   Junius Finner, PA-C  metroNIDAZOLE (FLAGYL) 500 MG tablet Take 1 tablet (500 mg total) by mouth 2 (two) times daily. 09/02/13   Junius Finner, PA-C  ondansetron  (ZOFRAN ODT) 4 MG disintegrating tablet Take 1 tablet (4 mg total) by mouth every 8 (eight) hours as needed for nausea. 11/17/13   Rolland Porter, MD  ondansetron (ZOFRAN ODT) 4 MG disintegrating tablet Take 1 tablet (4 mg total) by mouth every 8 (eight) hours as needed for nausea. 03/10/14   Rolland Porter, MD  oxyCODONE-acetaminophen (PERCOCET/ROXICET) 5-325 MG per tablet Take 1 tablet by mouth every 6 (six) hours as needed for severe pain. May take 2 tablets PO q 6 hours for severe pain - Do not take with Tylenol as this tablet already contains tylenol 09/02/13   Junius Finner, PA-C  predniSONE (DELTASONE) 10 MG tablet Take 2 tablets (20 mg total) by mouth daily. 11/17/13   Rolland Porter, MD   BP 142/79  Pulse 76  Temp(Src) 98.3 F (36.8 C) (Oral)  Resp 16  Ht 5\' 4"  (1.626 m)  Wt 135 lb (61.236 kg)  BMI 23.16 kg/m2  SpO2 100%  LMP 02/28/2014 Physical Exam  Nursing note and vitals reviewed. Constitutional: She is oriented to person, place, and time. She appears well-developed and well-nourished. No distress.  HENT:  Head: Normocephalic and atraumatic.  Mouth/Throat: Oropharynx is clear and moist.  Eyes: Conjunctivae and EOM are normal. Pupils are equal, round, and reactive to light.  Neck: Normal range of motion. Neck supple.  Cardiovascular: Normal rate, regular rhythm and intact distal pulses.   No murmur heard. Pulmonary/Chest: Effort normal and breath sounds normal. No respiratory distress. She has no wheezes. She has no rales.  Abdominal: Soft. She exhibits no distension. There is tenderness in the right lower quadrant. There is CVA tenderness. There is no rebound and no guarding.    Musculoskeletal: Normal range of motion. She exhibits no edema and no tenderness.  Neurological: She is alert and oriented to person, place, and time.  Skin: Skin is warm and dry. No rash noted. No erythema.  Psychiatric: She has a normal mood and affect. Her behavior is normal.    ED Course  Procedures  (including critical care time) Labs Review Labs Reviewed  URINALYSIS, ROUTINE W REFLEX MICROSCOPIC - Abnormal; Notable for the following:    APPearance CLOUDY (*)    All other components within normal limits  CBC WITH DIFFERENTIAL - Abnormal; Notable for the following:    RBC 3.80 (*)    MCH 34.2 (*)    All other components within normal limits  COMPREHENSIVE METABOLIC PANEL - Abnormal; Notable for the following:    Total Bilirubin <0.2 (*)    All other components within normal limits  URINE CULTURE    Imaging Review Ct Abdomen Pelvis Wo Contrast  03/12/2014   CLINICAL DATA:  Right flank pain  EXAM: CT ABDOMEN AND PELVIS WITHOUT CONTRAST  TECHNIQUE: Multidetector CT imaging of the abdomen and pelvis was performed  following the standard protocol without IV contrast.  COMPARISON:  11/17/2013  FINDINGS: Visualized lung bases clear. Surgical clips in the gallbladder fossa. Unremarkable liver, spleen ,adrenal glands, pancreas, right kidney.Poorly marginated hyperdense focus at the upper pole left kidney was present on the prior study. Unenhanced CT was performed per clinician order. Lack of IV contrast limits sensitivity and specificity, especially for evaluation of abdominal/pelvic solid viscera. No nephrolithiasis. No hydronephrosis. Minimal aortoiliac calcified plaque without aneurysm. Stomach, small bowel, and colon are nondilated. The appendix is not visualized. Urinary bladder is incompletely distended. Uterus and adnexal regions unremarkable. Bilateral pelvic phleboliths. Small amount free pelvic fluid. No free air. No adenopathy localized. Facet disease in the lower lumbar spine.  IMPRESSION: 1. No acute abdominal process.   Electronically Signed   By: Oley Balm M.D.   On: 03/12/2014 19:26     EKG Interpretation None      MDM   Final diagnoses:  Right flank pain    Patient with recent evaluation and diagnosis of pyelonephritis 2 days ago placed on Cipro, Vicodin and Zofran  who returns today with persistent complaints of dysuria and severe right flank pain that has worsened since last being seen. She has a history of Crohn's disease but no prior history of kidney stone.  UA today has improved from her prior and no blood present. Vital signs are stable. She denies any vaginal symptoms and states she does have a history of low back pain but this pain feels different. Also her Crohn's disease can cause pain on that side as well.  CT noncontrasted to rule out a kidney stone as the cause of the pain. Could be lingering pyelonephritis. A urine culture done as one was not done 2 days ago. Low suspicion for obstruction and since patient has been taking Zofran she's been able to tolerate by mouth.    CBC, CMP, CT abdomen and pelvis without contrast pending.  Patient given pain control  7:50 PM Labs and imaging neg.  Will continue to treat pain and continue cipro.  Urine culture pending.  Gwyneth Sprout, MD 03/12/14 1950

## 2014-03-13 LAB — URINE CULTURE
COLONY COUNT: NO GROWTH
CULTURE: NO GROWTH

## 2014-03-31 ENCOUNTER — Ambulatory Visit: Payer: Self-pay | Attending: Internal Medicine

## 2015-06-17 IMAGING — CT CT ABD-PELV W/O CM
2 of 4 series · 16 of 46 positions shown, 18 images · non-contrast
Comparison: CT ABD/PELV WO CM dated 09/02/2013; CT ABD/PELVIS W CM
dated 07/16/2011; CT ABD W/CM dated 01/09/2005

CLINICAL DATA: Right lower quadrant abdominal pain radiating to the
right flank. Dysuria. Hematuria.

EXAM:
CT ABDOMEN AND PELVIS WITHOUT CONTRAST
TECHNIQUE: Multidetector CT imaging of the abdomen and pelvis was performed
following the standard protocol without intravenous contrast.

[Series 2: renal stone < 200 lbs 5.0 b31f · axial · 0.76mm/px · z∈[-430,-145]mm · 13 of 67 slices shown, 15 images]
[im 5/67  soft-tissue]
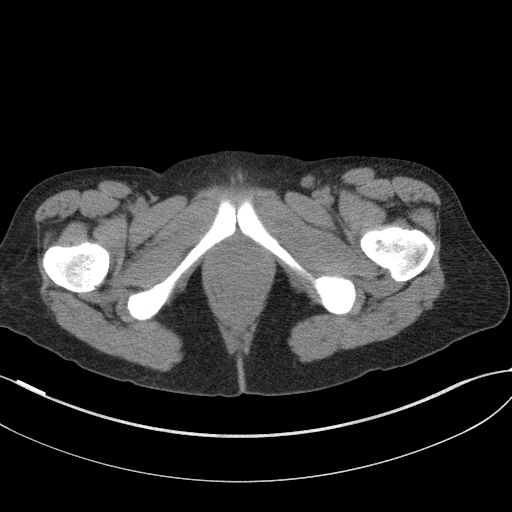
[im 5/67  bone]
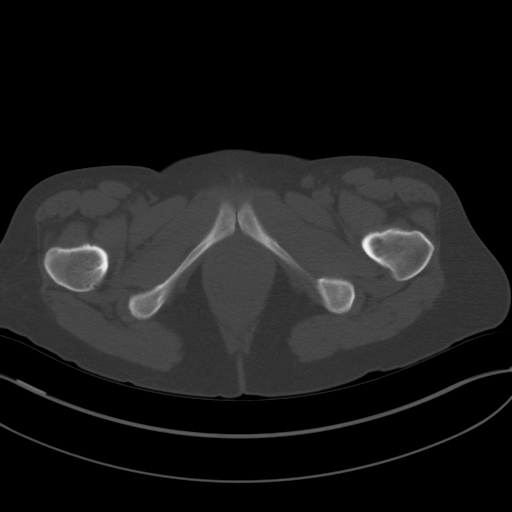
[im 10/67  soft-tissue]
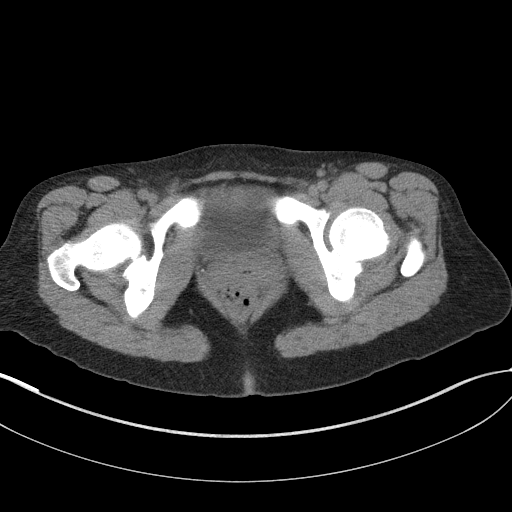
[im 15/67  soft-tissue]
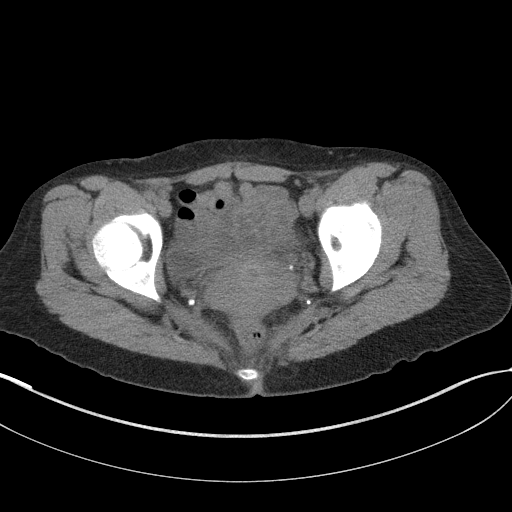
[im 19/67  soft-tissue]
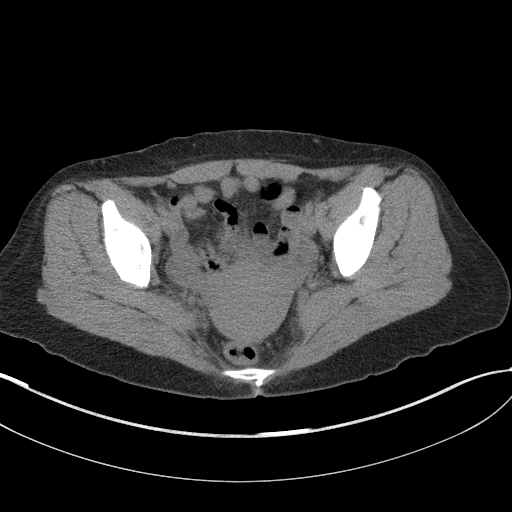
[im 24/67  soft-tissue]
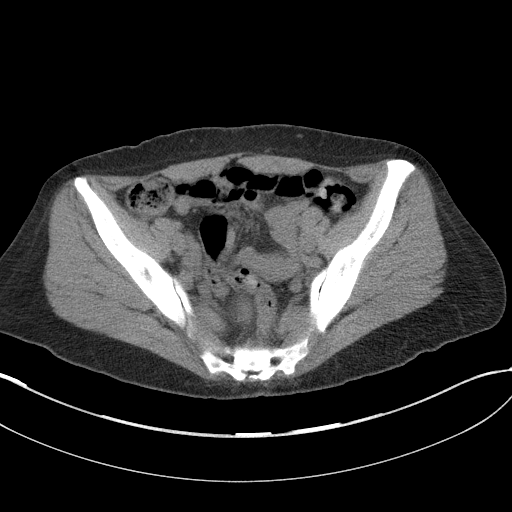
[im 29/67  soft-tissue]
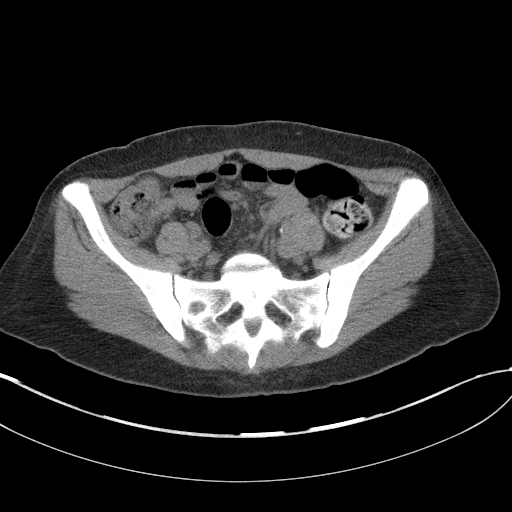
[im 34/67  soft-tissue]
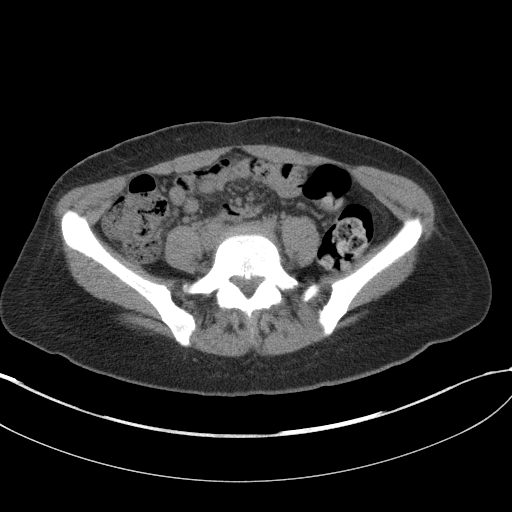
[im 38/67  soft-tissue]
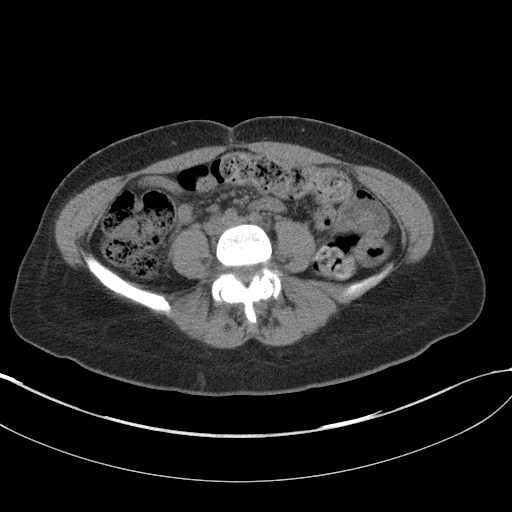
[im 43/67  soft-tissue]
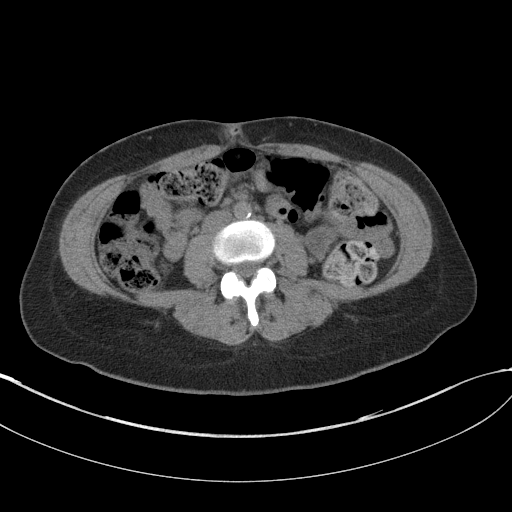
[im 43/67  bone]
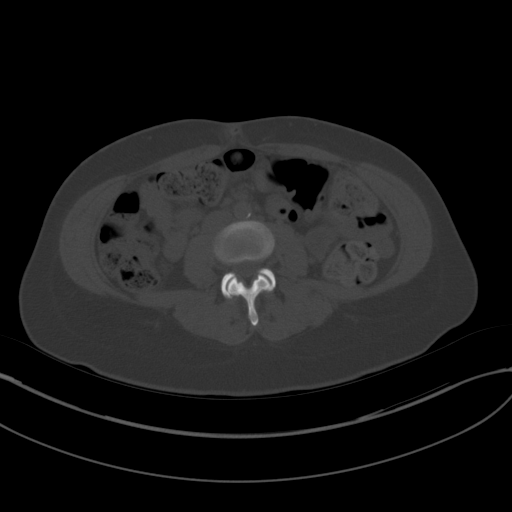
[im 48/67  soft-tissue]
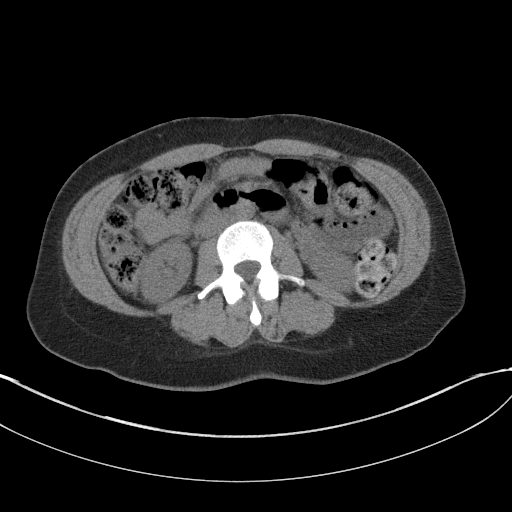
[im 52/67  soft-tissue]
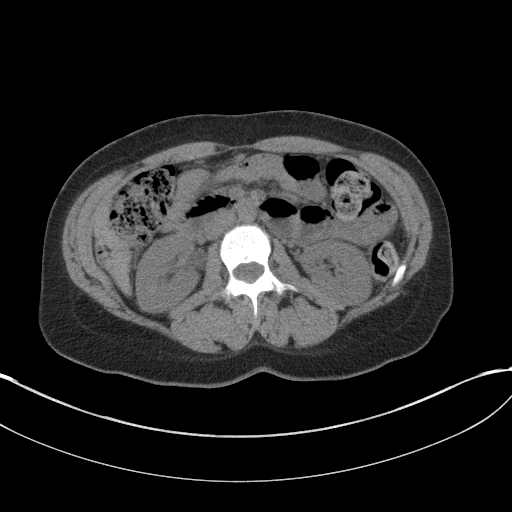
[im 57/67  soft-tissue]
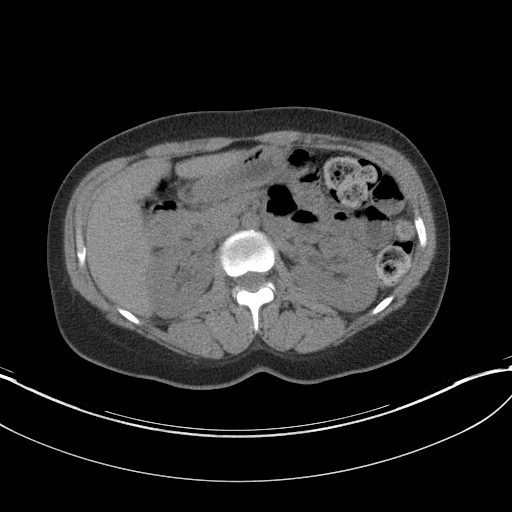
[im 62/67  soft-tissue]
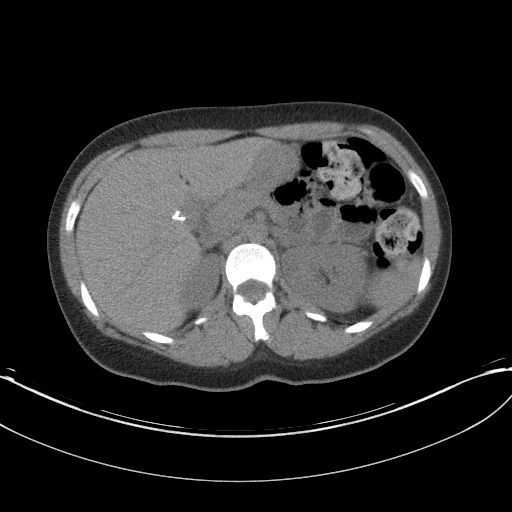

[Series 5: renal stone 3.0 coronal · coronal · 0.70mm/px · 3 of 67 slices shown]
[im 23/67  soft-tissue]
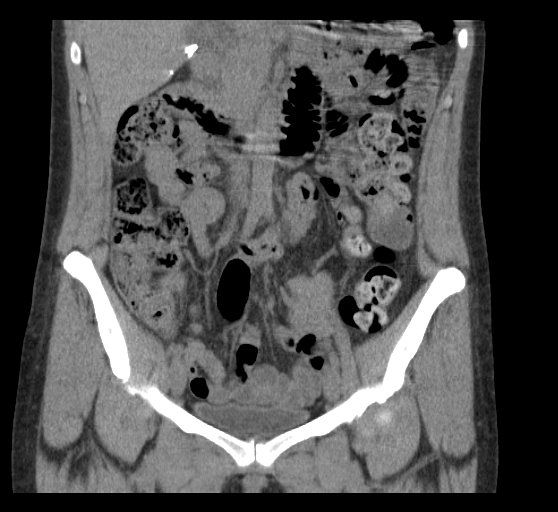
[im 30/67  soft-tissue]
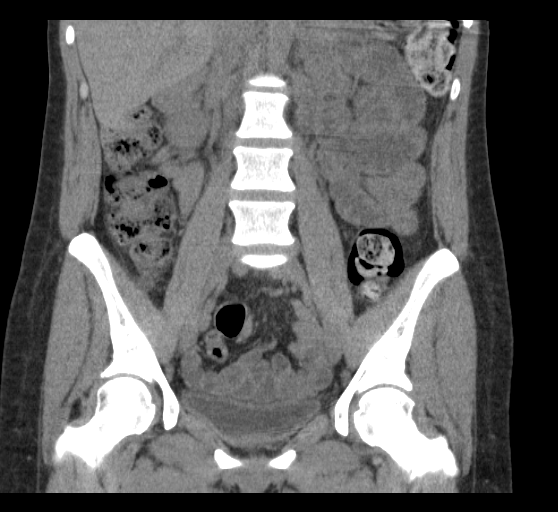
[im 37/67  soft-tissue]
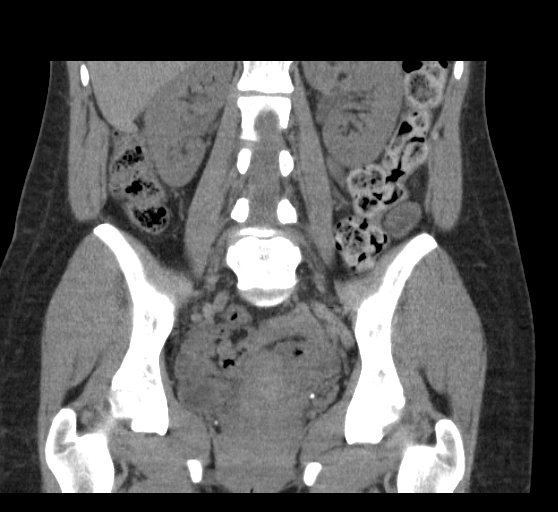

[16 of 46 positions shown; findings below may reference images not displayed]

FINDINGS: Prior cholecystectomy. The noncontrast CT appearance of the liver,
spleen, pancreas, and adrenal glands is within normal limits.
Prominent stool throughout the colon favors constipation. Borderline
dilated loops of proximal small bowel with scattered air-fluid
levels.

No pathologic upper abdominal adenopathy is observed. No pathologic
pelvic adenopathy is observed. Uterine and adnexal contours
unremarkable. Sclerosis along the sacroiliac joints noted
bilaterally. Lower lumbar facet arthropathy noted with
intervertebral spurring likely contributing to mild left foraminal
stenosis at L5-S1. Diffuse disc bulge at L4-5.

A small segment of the proximal appendix is visible in does not
appear inflamed.

Kidneys and proximal ureters unremarkable. Vascular calcifications
noted in the pelvis.
IMPRESSION: 1. A small segment of the appendix is visualized and appears normal
-the distal appendix is obscured by surrounding bowel.
2.  Prominent stool throughout the colon favors constipation.
3. Borderline dilated loops of proximal small bowel with scattered
air-fluid levels. This could reflect proximal enteritis but is not
dissimilar to the appearance back in [REDACTED]. Accordingly,
noninfectious causes of proximal bowel dilatation and mild fold
thickening might be considered and, including vasculitis, Whipple
disease, amyloidosis, eosinophilic enteritis, and Crohn's disease.
4. Lower lumbar spondylosis and degenerative disc disease.

## 2019-04-08 ENCOUNTER — Other Ambulatory Visit: Payer: Self-pay

## 2019-04-08 ENCOUNTER — Emergency Department (HOSPITAL_BASED_OUTPATIENT_CLINIC_OR_DEPARTMENT_OTHER)
Admission: EM | Admit: 2019-04-08 | Discharge: 2019-04-08 | Disposition: A | Discharge status: Home / Self Care | Payer: Self-pay | Attending: Emergency Medicine | Admitting: Emergency Medicine

## 2019-04-08 ENCOUNTER — Encounter (HOSPITAL_BASED_OUTPATIENT_CLINIC_OR_DEPARTMENT_OTHER): Payer: Self-pay

## 2019-04-08 DIAGNOSIS — F17210 Nicotine dependence, cigarettes, uncomplicated: Secondary | ICD-10-CM | POA: Insufficient documentation

## 2019-04-08 DIAGNOSIS — Z79899 Other long term (current) drug therapy: Secondary | ICD-10-CM | POA: Insufficient documentation

## 2019-04-08 DIAGNOSIS — R1011 Right upper quadrant pain: Secondary | ICD-10-CM | POA: Insufficient documentation

## 2019-04-08 DIAGNOSIS — K0889 Other specified disorders of teeth and supporting structures: Secondary | ICD-10-CM | POA: Insufficient documentation

## 2019-04-08 LAB — URINALYSIS, ROUTINE W REFLEX MICROSCOPIC
Bilirubin Urine: NEGATIVE
Glucose, UA: NEGATIVE mg/dL
Hgb urine dipstick: NEGATIVE
Ketones, ur: NEGATIVE mg/dL
Leukocytes,Ua: NEGATIVE
Nitrite: NEGATIVE
Protein, ur: NEGATIVE mg/dL
Specific Gravity, Urine: 1.025 (ref 1.005–1.030)
pH: 6 (ref 5.0–8.0)

## 2019-04-08 LAB — COMPREHENSIVE METABOLIC PANEL
ALT: 17 U/L (ref 0–44)
AST: 19 U/L (ref 15–41)
Albumin: 3.7 g/dL (ref 3.5–5.0)
Alkaline Phosphatase: 59 U/L (ref 38–126)
Anion gap: 7 (ref 5–15)
BUN: 12 mg/dL (ref 6–20)
CO2: 24 mmol/L (ref 22–32)
Calcium: 8.7 mg/dL — ABNORMAL LOW (ref 8.9–10.3)
Chloride: 106 mmol/L (ref 98–111)
Creatinine, Ser: 0.69 mg/dL (ref 0.44–1.00)
GFR calc Af Amer: >60 mL/min (ref >60)
GFR calc non Af Amer: >60 mL/min (ref >60)
Glucose, Bld: 101 mg/dL — ABNORMAL HIGH (ref 70–99)
Potassium: 3.7 mmol/L (ref 3.5–5.1)
Sodium: 137 mmol/L (ref 135–145)
Total Bilirubin: 0.3 mg/dL (ref 0.3–1.2)
Total Protein: 7.1 g/dL (ref 6.5–8.1)

## 2019-04-08 LAB — CBC WITH DIFFERENTIAL/PLATELET
Abs Immature Granulocytes: 0.02 10*3/uL (ref 0.00–0.07)
Basophils Absolute: 0.1 10*3/uL (ref 0.0–0.1)
Basophils Relative: 1 %
Eosinophils Absolute: 0.2 10*3/uL (ref 0.0–0.5)
Eosinophils Relative: 2 %
HCT: 40.6 % (ref 36.0–46.0)
Hemoglobin: 13.5 g/dL (ref 12.0–15.0)
Immature Granulocytes: 0 %
Lymphocytes Relative: 25 %
Lymphs Abs: 2.7 10*3/uL (ref 0.7–4.0)
MCH: 32.9 pg (ref 26.0–34.0)
MCHC: 33.3 g/dL (ref 30.0–36.0)
MCV: 99 fL (ref 80.0–100.0)
Monocytes Absolute: 1.1 10*3/uL — ABNORMAL HIGH (ref 0.1–1.0)
Monocytes Relative: 10 %
Neutro Abs: 6.8 10*3/uL (ref 1.7–7.7)
Neutrophils Relative %: 62 %
Platelets: 183 10*3/uL (ref 150–400)
RBC: 4.1 MIL/uL (ref 3.87–5.11)
RDW: 12.2 % (ref 11.5–15.5)
WBC: 10.9 10*3/uL — ABNORMAL HIGH (ref 4.0–10.5)
nRBC: 0 % (ref 0.0–0.2)

## 2019-04-08 LAB — PREGNANCY, URINE: Preg Test, Ur: NEGATIVE

## 2019-04-08 LAB — LIPASE, BLOOD: Lipase: 30 U/L (ref 11–51)

## 2019-04-08 MED ORDER — ACETAMINOPHEN 500 MG PO TABS: 500.0000 mg | ORAL_TABLET | Freq: Four times a day (QID) | ORAL | 0 refills | Status: AC | PRN

## 2019-04-08 MED ORDER — IBUPROFEN 600 MG PO TABS: 600.0000 mg | ORAL_TABLET | Freq: Four times a day (QID) | ORAL | 0 refills | Status: AC | PRN

## 2019-04-08 MED ORDER — METHOCARBAMOL 500 MG PO TABS: 500.0000 mg | ORAL_TABLET | Freq: Two times a day (BID) | ORAL | 0 refills | Status: AC

## 2019-04-08 MED ORDER — PENICILLIN V POTASSIUM 500 MG PO TABS: 500.0000 mg | ORAL_TABLET | Freq: Four times a day (QID) | ORAL | 0 refills | Status: AC

## 2019-04-08 NOTE — Discharge Instructions (Signed)
Make sure to take penicillin until completed.  You can alternate with ibuprofen and Tylenol as needed for pain.  Take Robaxin twice daily as needed for muscle pain or spasms.  Do not drive or operate machinery while taking this medication.  Please follow-up with your doctor this week for recheck.  Please follow-up with Dr. Radford Pax by calling his office tomorrow to make an appointment.  Make sure to bring your paperwork to your appointment and [facility] may be able to help cover your finances of the visit.  Please return the emergency department immediately if develop any new or worsening symptoms including worsening or more persistent abdominal pain, vomiting, diarrhea, fever of 100.4.

## 2019-04-08 NOTE — ED Triage Notes (Signed)
Pt c/o right side abd pain x 1 week-swelling to right side of face x 2 days-NAD-steady gait

## 2019-04-08 NOTE — ED Provider Notes (Signed)
[facility] EMERGENCY DEPARTMENT Provider Note   CSN: [id number] Arrival date & time: 04/08/19  1634    History   Chief Complaint Chief Complaint  Patient presents with  . Abdominal Pain  . Facial Swelling    HPI Michelle Luna is a 43 y.o. female with history of Crohn's disease who presents with a one-week history of right-sided abdominal pain as well as a 2-day history of right-sided facial swelling and gum pain.  Regarding the abdominal pain, patient states the pain is present with breathing or coughing and describes it as a torch, tearing-like pain.  She feels like her muscles twisting.  She denies any associated symptoms including fever, nausea, vomiting, diarrhea, bloody stools, urinary symptoms.  She reports a little bit of pain in her right flank intermittently.  She has no history of kidney stones.  She denies any chest pain, shortness of breath.  Patient reports facial swelling her right side of face after a chip went into a decayed tooth hole in her gum.  She has not been taking any medication at home for her symptoms.  She has several decayed teeth that have been worked on in the past.     HPI  Past Medical History:  Diagnosis Date  . Degenerative disk disease   . Gestational diabetes   . UTI (lower urinary tract infection)     There are no active problems to display for this patient.   Past Surgical History:  Procedure Laterality Date  . CESAREAN SECTION    . CHOLECYSTECTOMY    . EXPLORATORY LAPAROTOMY    . HERNIA REPAIR    . TUBAL LIGATION       OB History   No obstetric history on file.      Home Medications    Prior to Admission medications   Medication Sig Start Date End Date Taking? Authorizing Provider  acetaminophen (TYLENOL) 500 MG tablet Take 1 tablet (500 mg total) by mouth every 6 (six) hours as needed. 04/08/19   Aranza Geddes, Waylan Boga, PA-C  albuterol (PROVENTIL) (5 MG/ML) 0.5% nebulizer solution Take 2.5 mg by nebulization once.       [provider]  albuterol (PROVENTIL,VENTOLIN) 90 MCG/ACT inhaler Inhale 2 puffs into the lungs as needed. Shortness of breath     [provider]  ciprofloxacin (CIPRO) 500 MG tablet Take 1 tablet (500 mg total) by mouth 2 (two) times daily. 09/02/13   Lurene Shadow, PA-C  ciprofloxacin (CIPRO) 500 MG tablet Take 1 tablet (500 mg total) by mouth every 12 (twelve) hours. 03/10/14   Rolland Porter, MD  cyclobenzaprine (FLEXERIL) 10 MG tablet Take 1 tablet (10 mg total) by mouth 2 (two) times daily as needed for muscle spasms. 12/02/13   Reuben Likes, MD  diclofenac (VOLTAREN) 75 MG EC tablet Take 1 tablet (75 mg total) by mouth 2 (two) times daily. 12/02/13   Reuben Likes, MD  EPINEPHrine (EPIPEN) 0.3 mg/0.3 mL DEVI Inject 0.3 mLs (0.3 mg total) into the muscle as needed. 05/11/11   Geoffery Lyons, MD  HYDROcodone-acetaminophen (NORCO/VICODIN) 5-325 MG per tablet Take 2 tablets by mouth every 4 (four) hours as needed for pain. 11/09/12   Teressa Lower, NP  HYDROcodone-acetaminophen (NORCO/VICODIN) 5-325 MG per tablet Take 2 tablets by mouth every 4 (four) hours as needed. 11/17/13   Rolland Porter, MD  HYDROcodone-acetaminophen (NORCO/VICODIN) 5-325 MG per tablet 1 to 2 tabs every 4 to 6 hours as needed for pain. 12/02/13  Harden Mo, MD  HYDROcodone-acetaminophen (NORCO/VICODIN) 5-325 MG per tablet Take 2 tablets by mouth every 4 (four) hours as needed. 03/10/14   Tanna Furry, MD  ibuprofen (ADVIL) 600 MG tablet Take 1 tablet (600 mg total) by mouth every 6 (six) hours as needed. 04/08/19   Taygan Connell, Bea Graff, PA-C  methocarbamol (ROBAXIN) 500 MG tablet Take 1 tablet (500 mg total) by mouth 2 (two) times daily. 04/08/19   Angelena Sand, Bea Graff, PA-C  metroNIDAZOLE (FLAGYL) 500 MG tablet Take 1 tablet (500 mg total) by mouth 2 (two) times daily. 09/02/13   Noe Gens, PA-C  ondansetron (ZOFRAN ODT) 4 MG disintegrating tablet Take 1 tablet (4 mg total) by mouth every 8 (eight) hours as  needed for nausea. 11/17/13   Tanna Furry, MD  ondansetron (ZOFRAN ODT) 4 MG disintegrating tablet Take 1 tablet (4 mg total) by mouth every 8 (eight) hours as needed for nausea. 03/10/14   Tanna Furry, MD  oxyCODONE-acetaminophen (PERCOCET/ROXICET) 5-325 MG per tablet Take 1 tablet by mouth every 6 (six) hours as needed for severe pain. May take 2 tablets PO q 6 hours for severe pain - Do not take with Tylenol as this tablet already contains tylenol 09/02/13   Noe Gens, PA-C  oxyCODONE-acetaminophen (PERCOCET/ROXICET) 5-325 MG per tablet Take 1-2 tablets by mouth every 6 (six) hours as needed for severe pain. 03/12/14   Blanchie Dessert, MD  penicillin v potassium (VEETID) 500 MG tablet Take 1 tablet (500 mg total) by mouth 4 (four) times daily for 7 days. 04/08/19 04/15/19  Frederica Kuster, PA-C  predniSONE (DELTASONE) 10 MG tablet Take 2 tablets (20 mg total) by mouth daily. 11/17/13   Tanna Furry, MD    Family History No family history on file.  Social History Social History   Tobacco Use  . Smoking status: Current Every Day Smoker    Packs/day: 0.75    Years: 20.00    Pack years: 15.00    Types: Cigarettes  . Smokeless tobacco: Never Used  Substance Use Topics  . Alcohol use: Not Currently  . Drug use: No     Allergies   Erythromycin and Sulfa antibiotics   Review of Systems Review of Systems  Constitutional: Negative for chills and fever.  HENT: Positive for dental problem and facial swelling. Negative for sore throat.   Respiratory: Negative for shortness of breath.   Cardiovascular: Negative for chest pain.  Gastrointestinal: Positive for abdominal pain. Negative for diarrhea, nausea and vomiting.  Genitourinary: Negative for dysuria, vaginal bleeding and vaginal discharge.  Musculoskeletal: Negative for back pain.  Skin: Negative for rash and wound.  Neurological: Negative for headaches.  Psychiatric/Behavioral: The patient is not nervous/anxious.      Physical  Exam Updated Vital Signs BP 107/82 (BP Location: Left Arm)   Pulse 71   Temp 98.5 F (36.9 C) (Oral)   Resp 16   Ht 5\' 4"  (1.626 m)   Wt 59 kg   LMP 03/31/2019   SpO2 97%   BMI 22.31 kg/m   Physical Exam Vitals signs and nursing note reviewed.  Constitutional:      General: She is not in acute distress.    Appearance: She is well-developed. She is not diaphoretic.  HENT:     Head: Normocephalic and atraumatic.     Mouth/Throat:     Pharynx: No oropharyngeal exudate.      Comments: Right-sided facial swelling without erythema, associated tenderness, no abscess Eyes:  General: No scleral icterus.       Right eye: No discharge.        Left eye: No discharge.     Conjunctiva/sclera: Conjunctivae normal.     Pupils: Pupils are equal, round, and reactive to light.  Neck:     Musculoskeletal: Normal range of motion and neck supple.     Thyroid: No thyromegaly.  Cardiovascular:     Rate and Rhythm: Normal rate and regular rhythm.     Heart sounds: Normal heart sounds. No murmur. No friction rub. No gallop.   Pulmonary:     Effort: Pulmonary effort is normal. No respiratory distress.     Breath sounds: Normal breath sounds. No stridor. No wheezing or rales.  Abdominal:     General: Bowel sounds are normal. There is no distension.     Palpations: Abdomen is soft.     Tenderness: There is no abdominal tenderness. There is no right CVA tenderness, left CVA tenderness, guarding or rebound.  Lymphadenopathy:     Cervical: No cervical adenopathy.  Skin:    General: Skin is warm and dry.     Coloration: Skin is not pale.     Findings: No rash.  Neurological:     Mental Status: She is alert.     Coordination: Coordination normal.      ED Treatments / Results  Labs (all labs ordered are listed, but only abnormal results are displayed) Labs Reviewed  COMPREHENSIVE METABOLIC PANEL - Abnormal; Notable for the following components:      Result Value   Glucose, Bld 101 (*)     Calcium 8.7 (*)    All other components within normal limits  CBC WITH DIFFERENTIAL/PLATELET - Abnormal; Notable for the following components:   WBC 10.9 (*)    Monocytes Absolute 1.1 (*)    All other components within normal limits  LIPASE, BLOOD  URINALYSIS, ROUTINE W REFLEX MICROSCOPIC  PREGNANCY, URINE    EKG None  Radiology No results found.  Procedures Procedures (including critical care time)  Medications Ordered in ED Medications - No data to display   Initial Impression / Assessment and Plan / ED Course  I have reviewed the triage vital signs and the nursing notes.  Pertinent labs & imaging results that were available during my care of the patient were reviewed by me and considered in my medical decision making (see chart for details).        Patient presenting with 1 week of abdominal pain and 2-day history of right-sided facial swelling and dental pain.  Abdominal pain is only with certain movements and coughing or breathing.  Labs are unremarkable, except very mild leukocytosis at 10.9, which could be related to dental infection.  Abdominal exam is nontender and patient has no pain on my exam.  She is also status post cholecystectomy.  Do not feel emergent imaging is indicated.  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  Regarding dental pain, no evidence of drainable abscess or deep space infection.  Will treat with penicillin.  Also discharged home with Robaxin, ibuprofen, and Tylenol.  Follow-up to PCP as needed as well as dentist.  Patient given resources.  Return precautions discussed.  Patient understands and agrees with plan.  Patient vitals stable throughout ED course and discharged in satisfactory condition. I discussed patient case with Dr. Denton LankSteinl who guided the patient's management and agrees with plan.   Final Clinical Impressions(s) / ED Diagnoses  Final diagnoses:  Right upper quadrant abdominal pain   Pain, dental    ED Discharge Orders         Ordered    penicillin v potassium (VEETID) 500 MG tablet  4 times daily     04/08/19 1946    ibuprofen (ADVIL) 600 MG tablet  Every 6 hours PRN     04/08/19 1946    acetaminophen (TYLENOL) 500 MG tablet  Every 6 hours PRN     04/08/19 1946    methocarbamol (ROBAXIN) 500 MG tablet  2 times daily     04/08/19 1946           Emi HolesLaw, Leondre Taul M, PA-C 04/09/19 1538    Cathren LaineSteinl, Kevin, MD 04/10/19 1711

## 2020-10-07 ENCOUNTER — Other Ambulatory Visit (HOSPITAL_BASED_OUTPATIENT_CLINIC_OR_DEPARTMENT_OTHER): Payer: Self-pay | Admitting: Emergency Medicine

## 2020-10-07 ENCOUNTER — Encounter (HOSPITAL_BASED_OUTPATIENT_CLINIC_OR_DEPARTMENT_OTHER): Payer: Self-pay | Admitting: Emergency Medicine

## 2020-10-07 ENCOUNTER — Other Ambulatory Visit: Payer: Self-pay

## 2020-10-07 ENCOUNTER — Emergency Department (HOSPITAL_BASED_OUTPATIENT_CLINIC_OR_DEPARTMENT_OTHER)
Admission: EM | Admit: 2020-10-07 | Discharge: 2020-10-07 | Disposition: A | Discharge status: Home / Self Care | Payer: HRSA Program | Attending: Emergency Medicine | Admitting: Emergency Medicine

## 2020-10-07 DIAGNOSIS — F17210 Nicotine dependence, cigarettes, uncomplicated: Secondary | ICD-10-CM | POA: Insufficient documentation

## 2020-10-07 DIAGNOSIS — U071 COVID-19: Secondary | ICD-10-CM | POA: Diagnosis not present

## 2020-10-07 DIAGNOSIS — R109 Unspecified abdominal pain: Secondary | ICD-10-CM | POA: Diagnosis present

## 2020-10-07 LAB — URINALYSIS, ROUTINE W REFLEX MICROSCOPIC
Bilirubin Urine: NEGATIVE
Glucose, UA: NEGATIVE mg/dL
Ketones, ur: 80 mg/dL — AB
Leukocytes,Ua: NEGATIVE
Nitrite: NEGATIVE
Protein, ur: 30 mg/dL — AB
Specific Gravity, Urine: 1.03 (ref 1.005–1.030)
pH: 6 (ref 5.0–8.0)

## 2020-10-07 LAB — BASIC METABOLIC PANEL
Anion gap: 12 (ref 5–15)
BUN: 13 mg/dL (ref 6–20)
CO2: 24 mmol/L (ref 22–32)
Calcium: 8.8 mg/dL — ABNORMAL LOW (ref 8.9–10.3)
Chloride: 103 mmol/L (ref 98–111)
Creatinine, Ser: 0.73 mg/dL (ref 0.44–1.00)
GFR, Estimated: >60 mL/min (ref >60)
Glucose, Bld: 94 mg/dL (ref 70–99)
Potassium: 3.2 mmol/L — ABNORMAL LOW (ref 3.5–5.1)
Sodium: 139 mmol/L (ref 135–145)

## 2020-10-07 LAB — URINALYSIS, MICROSCOPIC (REFLEX)

## 2020-10-07 LAB — CBC WITH DIFFERENTIAL/PLATELET
Abs Immature Granulocytes: 0.01 10*3/uL (ref 0.00–0.07)
Basophils Absolute: 0 10*3/uL (ref 0.0–0.1)
Basophils Relative: 0 %
Eosinophils Absolute: 0 10*3/uL (ref 0.0–0.5)
Eosinophils Relative: 0 %
HCT: 48.9 % — ABNORMAL HIGH (ref 36.0–46.0)
Hemoglobin: 16.8 g/dL — ABNORMAL HIGH (ref 12.0–15.0)
Immature Granulocytes: 0 %
Lymphocytes Relative: 25 %
Lymphs Abs: 1.4 10*3/uL (ref 0.7–4.0)
MCH: 32.5 pg (ref 26.0–34.0)
MCHC: 34.4 g/dL (ref 30.0–36.0)
MCV: 94.6 fL (ref 80.0–100.0)
Monocytes Absolute: 0.9 10*3/uL (ref 0.1–1.0)
Monocytes Relative: 15 %
Neutro Abs: 3.3 10*3/uL (ref 1.7–7.7)
Neutrophils Relative %: 60 %
Platelets: 145 10*3/uL — ABNORMAL LOW (ref 150–400)
RBC: 5.17 MIL/uL — ABNORMAL HIGH (ref 3.87–5.11)
RDW: 12.1 % (ref 11.5–15.5)
WBC: 5.5 10*3/uL (ref 4.0–10.5)
nRBC: 0 % (ref 0.0–0.2)

## 2020-10-07 LAB — RESP PANEL BY RT-PCR (FLU A&B, COVID) ARPGX2
Influenza A by PCR: NEGATIVE
Influenza B by PCR: NEGATIVE
SARS Coronavirus 2 by RT PCR: POSITIVE — AB

## 2020-10-07 LAB — PREGNANCY, URINE: Preg Test, Ur: NEGATIVE

## 2020-10-07 MED ORDER — ONDANSETRON HCL 4 MG/2ML IJ SOLN
4.0000 mg | Freq: Once | INTRAMUSCULAR | Status: AC
  Administered 2020-10-07: 4 mg via INTRAVENOUS
  Filled 2020-10-07: qty 2

## 2020-10-07 MED ORDER — METOCLOPRAMIDE HCL 5 MG/ML IJ SOLN
10.0000 mg | Freq: Once | INTRAMUSCULAR | Status: AC
Start: 2020-10-07 — End: 2020-10-07
  Administered 2020-10-07: 10 mg via INTRAVENOUS
  Filled 2020-10-07: qty 2

## 2020-10-07 MED ORDER — CEPHALEXIN 500 MG PO CAPS: 500.0000 mg | ORAL_CAPSULE | Freq: Three times a day (TID) | ORAL | 0 refills | Status: AC

## 2020-10-07 MED ORDER — SODIUM CHLORIDE 0.9 % IV BOLUS
1000.0000 mL | Freq: Once | INTRAVENOUS | Status: AC
  Administered 2020-10-07: 1000 mL via INTRAVENOUS

## 2020-10-07 MED ORDER — ONDANSETRON 4 MG PO TBDP: 4.0000 mg | ORAL_TABLET | Freq: Three times a day (TID) | ORAL | 0 refills | Status: AC | PRN

## 2020-10-07 MED ORDER — KETOROLAC TROMETHAMINE 15 MG/ML IJ SOLN
15.0000 mg | Freq: Once | INTRAMUSCULAR | Status: AC
  Administered 2020-10-07: 15 mg via INTRAVENOUS
  Filled 2020-10-07: qty 1

## 2020-10-07 MED ORDER — POTASSIUM CHLORIDE CRYS ER 20 MEQ PO TBCR
40.0000 meq | EXTENDED_RELEASE_TABLET | Freq: Once | ORAL | Status: AC
  Administered 2020-10-07: 40 meq via ORAL
  Filled 2020-10-07: qty 2

## 2020-10-07 MED FILL — ONDANSETRON ODT 4 MG TABLET: 4 | 3 days supply | Qty: 10 | Fill #0

## 2020-10-07 MED FILL — CEPHALEXIN 500 MG CAPSULE: 500 | 5 days supply | Qty: 15 | Fill #0

## 2020-10-07 NOTE — Discharge Instructions (Addendum)
Call your primary care doctor or specialist as discussed in the next 2-3 days.    Isolate at home until 9 days have passed. Return immediately if your O2 saturation drops below 90% or if you have worsening trouble breathing.   Return immediately back to the ER if:  Your symptoms worsen within the next 12-24 hours. You develop new symptoms such as new fevers, persistent vomiting, new pain, shortness of breath, or new weakness or numbness, or if you have any other concerns.

## 2020-10-07 NOTE — ED Provider Notes (Signed)
[facility] EMERGENCY DEPARTMENT Provider Note   CSN: [id number] Arrival date & time: 10/07/20  9470     History Chief Complaint  Patient presents with  . Abdominal Pain  . Headache    Michelle Luna is a 44 y.o. female.  Patient presents with nausea body aches headache and congestion. Ongoing for 1 week. Vomitus is described as nonbloody. Denies any diarrhea. Complaining of abdominal cramping pain as well. Unsure if she had fevers at home per patient.        Past Medical History:  Diagnosis Date  . Degenerative disk disease   . Gestational diabetes   . UTI (lower urinary tract infection)     There are no problems to display for this patient.   Past Surgical History:  Procedure Laterality Date  . CESAREAN SECTION    . CHOLECYSTECTOMY    . EXPLORATORY LAPAROTOMY    . HERNIA REPAIR    . TUBAL LIGATION       OB History   No obstetric history on file.     No family history on file.  Social History   Tobacco Use  . Smoking status: Current Every Day Smoker    Packs/day: 0.75    Years: 20.00    Pack years: 15.00    Types: Cigarettes  . Smokeless tobacco: Never Used  Vaping Use  . Vaping Use: Never used  Substance Use Topics  . Alcohol use: Yes  . Drug use: No    Home Medications Prior to Admission medications   Medication Sig Start Date End Date Taking? Authorizing Provider  acetaminophen (TYLENOL) 500 MG tablet Take 1 tablet (500 mg total) by mouth every 6 (six) hours as needed. 04/08/19   Law, Waylan Boga, PA-C  albuterol (PROVENTIL) (5 MG/ML) 0.5% nebulizer solution Take 2.5 mg by nebulization once.      [provider]  albuterol (PROVENTIL,VENTOLIN) 90 MCG/ACT inhaler Inhale 2 puffs into the lungs as needed. Shortness of breath     [provider]  ciprofloxacin (CIPRO) 500 MG tablet Take 1 tablet (500 mg total) by mouth 2 (two) times daily. 09/02/13   Lurene Shadow, PA-C  ciprofloxacin (CIPRO) 500 MG tablet Take  1 tablet (500 mg total) by mouth every 12 (twelve) hours. 03/10/14   Rolland Porter, MD  cyclobenzaprine (FLEXERIL) 10 MG tablet Take 1 tablet (10 mg total) by mouth 2 (two) times daily as needed for muscle spasms. 12/02/13   Reuben Likes, MD  diclofenac (VOLTAREN) 75 MG EC tablet Take 1 tablet (75 mg total) by mouth 2 (two) times daily. 12/02/13   Reuben Likes, MD  EPINEPHrine (EPIPEN) 0.3 mg/0.3 mL DEVI Inject 0.3 mLs (0.3 mg total) into the muscle as needed. 05/11/11   Geoffery Lyons, MD  HYDROcodone-acetaminophen (NORCO/VICODIN) 5-325 MG per tablet Take 2 tablets by mouth every 4 (four) hours as needed for pain. 11/09/12   Teressa Lower, NP  HYDROcodone-acetaminophen (NORCO/VICODIN) 5-325 MG per tablet Take 2 tablets by mouth every 4 (four) hours as needed. 11/17/13   Rolland Porter, MD  HYDROcodone-acetaminophen (NORCO/VICODIN) 5-325 MG per tablet 1 to 2 tabs every 4 to 6 hours as needed for pain. 12/02/13   Reuben Likes, MD  HYDROcodone-acetaminophen (NORCO/VICODIN) 5-325 MG per tablet Take 2 tablets by mouth every 4 (four) hours as needed. 03/10/14   Rolland Porter, MD  ibuprofen (ADVIL) 600 MG tablet Take 1 tablet (600 mg total) by mouth every 6 (six) hours as needed. 04/08/19  Law, Alexandra M, PA-C  methocarbamol (ROBAXIN) 500 MG tablet Take 1 tablet (500 mg total) by mouth 2 (two) times daily. 04/08/19   Law, Waylan Boga, PA-C  metroNIDAZOLE (FLAGYL) 500 MG tablet Take 1 tablet (500 mg total) by mouth 2 (two) times daily. 09/02/13   Lurene Shadow, PA-C  ondansetron (ZOFRAN ODT) 4 MG disintegrating tablet Take 1 tablet (4 mg total) by mouth every 8 (eight) hours as needed for nausea. 11/17/13   Rolland Porter, MD  ondansetron (ZOFRAN ODT) 4 MG disintegrating tablet Take 1 tablet (4 mg total) by mouth every 8 (eight) hours as needed for nausea. 03/10/14   Rolland Porter, MD  oxyCODONE-acetaminophen (PERCOCET/ROXICET) 5-325 MG per tablet Take 1 tablet by mouth every 6 (six) hours as needed for severe pain. May  take 2 tablets PO q 6 hours for severe pain - Do not take with Tylenol as this tablet already contains tylenol 09/02/13   Lurene Shadow, PA-C  oxyCODONE-acetaminophen (PERCOCET/ROXICET) 5-325 MG per tablet Take 1-2 tablets by mouth every 6 (six) hours as needed for severe pain. 03/12/14   Gwyneth Sprout, MD  predniSONE (DELTASONE) 10 MG tablet Take 2 tablets (20 mg total) by mouth daily. 11/17/13   Rolland Porter, MD    Allergies    Erythromycin and Sulfa antibiotics  Review of Systems   Review of Systems  HENT: Negative for ear pain.   Eyes: Negative for pain.  Respiratory: Negative for cough.   Cardiovascular: Negative for chest pain.  Gastrointestinal: Positive for abdominal pain.  Genitourinary: Negative for flank pain.  Musculoskeletal: Negative for back pain.  Skin: Negative for rash.  Neurological: Negative for headaches.    Physical Exam Updated Vital Signs BP (!) 133/91 (BP Location: Right Arm)   Pulse 68   Temp 98.3 F (36.8 C)   Resp 20   Ht 5\' 4"  (1.626 m)   Wt 61.2 kg   SpO2 98%   BMI 23.17 kg/m   Physical Exam Constitutional:      General: She is not in acute distress.    Appearance: Normal appearance.  HENT:     Head: Normocephalic.     Nose: Nose normal.  Eyes:     Extraocular Movements: Extraocular movements intact.  Cardiovascular:     Rate and Rhythm: Normal rate.  Pulmonary:     Effort: Pulmonary effort is normal.  Abdominal:     Palpations: Abdomen is soft.     Tenderness: There is no abdominal tenderness.  Musculoskeletal:        General: Normal range of motion.     Cervical back: Normal range of motion.  Skin:    General: Skin is warm.  Neurological:     General: No focal deficit present.     Mental Status: She is alert.     ED Results / Procedures / Treatments   Labs (all labs ordered are listed, but only abnormal results are displayed) Labs Reviewed  RESP PANEL BY RT-PCR (FLU A&B, COVID) ARPGX2 - Abnormal; Notable for the  following components:      Result Value   SARS Coronavirus 2 by RT PCR POSITIVE (*)    All other components within normal limits  CBC WITH DIFFERENTIAL/PLATELET - Abnormal; Notable for the following components:   RBC 5.17 (*)    Hemoglobin 16.8 (*)    HCT 48.9 (*)    Platelets 145 (*)    All other components within normal limits  BASIC METABOLIC PANEL - Abnormal; Notable  for the following components:   Potassium 3.2 (*)    Calcium 8.8 (*)    All other components within normal limits  PREGNANCY, URINE  URINALYSIS, ROUTINE W REFLEX MICROSCOPIC    EKG None  Radiology No results found.  Procedures Procedures (including critical care time)  Medications Ordered in ED Medications  sodium chloride 0.9 % bolus 1,000 mL (1,000 mLs Intravenous New Bag/Given 10/07/20 1002)  ondansetron (ZOFRAN) injection 4 mg (4 mg Intravenous Given 10/07/20 1000)  ketorolac (TORADOL) 15 MG/ML injection 15 mg (15 mg Intravenous Given 10/07/20 1001)  metoCLOPramide (REGLAN) injection 10 mg (10 mg Intravenous Given 10/07/20 1038)  potassium chloride SA (KLOR-CON) CR tablet 40 mEq (40 mEq Oral Given 10/07/20 1040)    ED Course  I have reviewed the triage vital signs and the nursing notes.  Pertinent labs & imaging results that were available during my care of the patient were reviewed by me and considered in my medical decision making (see chart for details).    MDM Rules/Calculators/A&P                          Vital signs within normal limits. Blood pressure normal. Labs unremarkable. Potassium slightly low at 3.2 and given oral replacement which the patient was able to tolerate.  Covid test came back positive. Discussed with patient monoclonal antibodies, she states that she is unsure if she wants them. I advised immediate return if she has difficulty breathing or if her O2 saturation drops below 90%. Patient expressed understanding will isolate at home. Otherwise discharged home in stable  condition.   Final Clinical Impression(s) / ED Diagnoses Final diagnoses:  COVID-19 virus infection    Rx / DC Orders ED Discharge Orders    None       Cheryll Cockayne, MD 10/07/20 1100

## 2020-10-07 NOTE — ED Triage Notes (Signed)
Sinus and h/a x 1 week and nausea and vomiting since last night

## 2023-07-04 ENCOUNTER — Other Ambulatory Visit: Payer: Self-pay | Admitting: Physician Assistant

## 2023-07-04 DIAGNOSIS — F17210 Nicotine dependence, cigarettes, uncomplicated: Secondary | ICD-10-CM

## 2023-07-05 ENCOUNTER — Other Ambulatory Visit: Payer: Self-pay | Admitting: Physician Assistant

## 2023-07-05 DIAGNOSIS — Z1231 Encounter for screening mammogram for malignant neoplasm of breast: Secondary | ICD-10-CM
